# Patient Record
Sex: Male | Born: 1955 | Race: White | Hispanic: No | Marital: Married | Smoking: Former smoker
Health system: Southern US, Community
[De-identification: ages and names within clinical notes are randomized; demographics above are authoritative.]

## PROBLEM LIST (undated history)

## (undated) DIAGNOSIS — G459 Transient cerebral ischemic attack, unspecified: Secondary | ICD-10-CM

## (undated) DIAGNOSIS — G43909 Migraine, unspecified, not intractable, without status migrainosus: Secondary | ICD-10-CM

## (undated) HISTORY — PX: APPENDECTOMY: SHX54

## (undated) HISTORY — DX: Migraine, unspecified, not intractable, without status migrainosus: G43.909

## (undated) HISTORY — PX: TONSILLECTOMY: SUR1361

---

## 1999-07-11 ENCOUNTER — Ambulatory Visit (HOSPITAL_COMMUNITY): Admission: RE | Admit: 1999-07-11 | Discharge: 1999-07-11 | Payer: Self-pay | Admitting: Family Medicine

## 1999-07-11 ENCOUNTER — Encounter: Payer: Self-pay | Admitting: Family Medicine

## 2002-12-19 ENCOUNTER — Ambulatory Visit (HOSPITAL_COMMUNITY): Admission: RE | Admit: 2002-12-19 | Discharge: 2002-12-19 | Payer: Self-pay | Admitting: Surgery

## 2011-11-09 ENCOUNTER — Other Ambulatory Visit: Payer: Self-pay | Admitting: Family Medicine

## 2011-11-09 DIAGNOSIS — R109 Unspecified abdominal pain: Secondary | ICD-10-CM

## 2011-11-10 ENCOUNTER — Ambulatory Visit
Admission: RE | Admit: 2011-11-10 | Discharge: 2011-11-10 | Disposition: A | Discharge status: Home / Self Care | Payer: BC Managed Care – PPO | Source: Ambulatory Visit | Attending: Family Medicine | Admitting: Family Medicine

## 2011-11-10 ENCOUNTER — Other Ambulatory Visit: Payer: Self-pay | Admitting: Family Medicine

## 2011-11-10 DIAGNOSIS — R109 Unspecified abdominal pain: Secondary | ICD-10-CM

## 2011-11-10 DIAGNOSIS — R509 Fever, unspecified: Secondary | ICD-10-CM

## 2011-11-17 ENCOUNTER — Ambulatory Visit: Payer: BC Managed Care – PPO | Admitting: Infectious Diseases

## 2011-11-17 ENCOUNTER — Ambulatory Visit (INDEPENDENT_AMBULATORY_CARE_PROVIDER_SITE_OTHER): Payer: BC Managed Care – PPO | Admitting: Infectious Diseases

## 2011-11-17 ENCOUNTER — Encounter: Payer: Self-pay | Admitting: Infectious Diseases

## 2011-11-17 VITALS — BP 142/83 | HR 94 | Temp 99.0°F | Ht 72.0 in | Wt 196.0 lb

## 2011-11-17 DIAGNOSIS — R509 Fever, unspecified: Secondary | ICD-10-CM

## 2011-11-17 NOTE — Assessment & Plan Note (Signed)
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 He is married. He is fairly old to have gotten a primary CMV or EBV/ mono like syndrome. Will screen him broadly for possible infectious causes of fever as well as non-infectious. He does not have sx that would point towards classical RA or temporal arteritis. He does have a hx of insect bites but these do not carry vectors (in the [place]) that would cause prolonged fevers. If his serologies are (-) and he continues to have fever, will send him for CT scan chest/abd/pelvis to r/o occult abscess or lymphoma.

## 2011-11-17 NOTE — Addendum Note (Signed)
Addended by: Mariea Clonts D on: 11/17/2011 03:39 PM   Modules accepted: Orders

## 2011-11-17 NOTE — Progress Notes (Signed)
  Subjective:    Patient ID: Harold Sherman, male    DOB: February 10, 1955, 56 y.o.   MRN: [id number]  HPI: 56 yo M with hx of Migraines, who beginning around October 29, has had daily fevers to the 101-102 range. They resolve with tylenol and motrin.  11-6 WBC 5.7, (occas atypical Lymphs), u/s R renal cyst, CXR (-). UA 2+ bacteria, 0-1 WBC, 1-3 RBC. AST 76, ALT 106. Hep A Ab (-). Hep B SAg/Core (-), Hep C (-).  Has also had some nausea, gi distress. Appetite has been normal. Usually walks 2-3 miles a day but has had decreased exercise tolerance. Has also noted pain in his R hip. Has been having headaches as well, mostly when he has fever. Rare dry cough.  No new soaps, shampoos, pets, new medicines.  Travel- [place] 2010.  On October 26, had family reunion. Had pork shoulder there and his sister also got fever after this. Her fever resolved after 3 days.   Had an uncle who died in early 21s with TB.   Review of Systems  Constitutional: Positive for fever. Negative for appetite change and unexpected weight change.  Respiratory: Negative for cough.   Gastrointestinal: Negative for diarrhea and constipation.  Genitourinary: Negative for dysuria and difficulty urinating.  Neurological: Positive for headaches.  Hematological: Negative for adenopathy.       Objective:   Physical Exam  Constitutional: He appears well-developed and well-nourished.  HENT:  Mouth/Throat: No oropharyngeal exudate.    Eyes: EOM are normal. Pupils are equal, round, and reactive to light. Right eye exhibits no discharge. Left eye exhibits no discharge. No scleral icterus.  Neck: Neck supple.  Cardiovascular: Normal rate, regular rhythm and normal heart sounds.   Pulmonary/Chest: Effort normal and breath sounds normal.  Abdominal: Soft. Bowel sounds are normal. He exhibits no mass. There is no tenderness. There is no rebound.  Lymphadenopathy:    He has no cervical adenopathy.  Skin: No rash noted.            Assessment & Plan:

## 2011-11-18 ENCOUNTER — Other Ambulatory Visit: Payer: Self-pay | Admitting: Infectious Diseases

## 2011-11-18 ENCOUNTER — Other Ambulatory Visit (INDEPENDENT_AMBULATORY_CARE_PROVIDER_SITE_OTHER): Payer: BC Managed Care – PPO

## 2011-11-18 DIAGNOSIS — R509 Fever, unspecified: Secondary | ICD-10-CM

## 2011-11-18 LAB — COMPREHENSIVE METABOLIC PANEL
AST: 153 U/L — ABNORMAL HIGH (ref 0–37)
Albumin: 3.5 g/dL (ref 3.5–5.2)
Alkaline Phosphatase: 175 U/L — ABNORMAL HIGH (ref 39–117)
BUN: 13 mg/dL (ref 6–23)
Glucose, Bld: 98 mg/dL (ref 70–99)
Potassium: 4 mEq/L (ref 3.5–5.3)
Sodium: 135 mEq/L (ref 135–145)
Total Bilirubin: 0.8 mg/dL (ref 0.3–1.2)
Total Protein: 6.8 g/dL (ref 6.0–8.3)

## 2011-11-19 ENCOUNTER — Telehealth: Payer: Self-pay | Admitting: *Deleted

## 2011-11-19 LAB — CBC WITH DIFFERENTIAL/PLATELET
Basophils Absolute: 0.2 10*3/uL — ABNORMAL HIGH (ref 0.0–0.1)
Basophils Relative: 3 % — ABNORMAL HIGH (ref 0–1)
Eosinophils Absolute: 0.1 10*3/uL (ref 0.0–0.7)
Eosinophils Relative: 2 % (ref 0–5)
HCT: 40.3 % (ref 39.0–52.0)
Hemoglobin: 14 g/dL (ref 13.0–17.0)
Lymphocytes Relative: 64 % — ABNORMAL HIGH (ref 12–46)
Lymphs Abs: 4.5 10*3/uL — ABNORMAL HIGH (ref 0.7–4.0)
MCH: 30 pg (ref 26.0–34.0)
MCHC: 34.7 g/dL (ref 30.0–36.0)
MCV: 86.5 fL (ref 78.0–100.0)
Monocytes Absolute: 0.4 10*3/uL (ref 0.1–1.0)
Monocytes Relative: 5 % (ref 3–12)
Neutro Abs: 1.8 10*3/uL (ref 1.7–7.7)
Neutrophils Relative %: 26 % — ABNORMAL LOW (ref 43–77)
Platelets: 173 10*3/uL (ref 150–400)
RBC: 4.66 MIL/uL (ref 4.22–5.81)
RDW: 12.8 % (ref 11.5–15.5)
WBC: 7 10*3/uL (ref 4.0–10.5)

## 2011-11-19 LAB — RPR

## 2011-11-19 NOTE — Telephone Encounter (Signed)
Patient notified to stop Tylenol due to elevated liver tests per Dr. Ninetta Lights. Wendall Mola CMA

## 2011-11-19 NOTE — Progress Notes (Signed)
Patient notified Harold Sherman  

## 2011-11-19 NOTE — Telephone Encounter (Signed)
Message copied by Macy Mis on Fri Nov 19, 2011  1:30 PM ------      Message from: HATCHER, JEFFREY C      Created: Fri Nov 19, 2011 10:40 AM       Pt needs to stop tylenol. When is his f/u appt?

## 2011-11-22 LAB — HIV-1 RNA ULTRAQUANT REFLEX TO GENTYP+
HIV 1 RNA Quant: <20 copies/mL (ref <20)
HIV-1 RNA Quant, Log: <1.3 {Log} (ref <1.30)

## 2011-11-22 LAB — TOXOPLASMA GONDII ANTIBODY, IGG: Toxoplasma IgG Ratio: 44.4 IU/mL — ABNORMAL HIGH (ref <6.0)

## 2011-11-22 LAB — CMV IGM: CMV IgM: >3.5 — ABNORMAL HIGH (ref <0.90)

## 2011-11-22 LAB — QUANTIFERON TB GOLD ASSAY (BLOOD): Interferon Gamma Release Assay: NEGATIVE

## 2011-11-24 LAB — CULTURE, BLOOD (SINGLE): Organism ID, Bacteria: NO GROWTH

## 2011-11-30 ENCOUNTER — Ambulatory Visit (INDEPENDENT_AMBULATORY_CARE_PROVIDER_SITE_OTHER): Payer: BC Managed Care – PPO | Admitting: Infectious Diseases

## 2011-11-30 ENCOUNTER — Encounter: Payer: Self-pay | Admitting: Infectious Diseases

## 2011-11-30 VITALS — BP 152/88 | HR 73 | Temp 98.1°F | Wt 187.0 lb

## 2011-11-30 DIAGNOSIS — R509 Fever, unspecified: Secondary | ICD-10-CM

## 2011-11-30 NOTE — Progress Notes (Signed)
  Subjective:    Patient ID: Harold Sherman, male    DOB: [birth year], 56 y.o.   MRN: [id number]  HPI 56 yo M with hx of Migraines, who beginning around October 29, has had daily fevers to the 101-102 range. They resolve with tylenol and motrin.  (11-10-11) WBC 5.7, (occas atypical Lymphs), u/s R renal cyst, CXR (-). UA 2+ bacteria, 0-1 WBC, 1-3 RBC. AST 76, ALT 106. Hep A Ab (-). Hep B SAg/Core (-), Hep C (-).   Travel- [place] 2010. On October 26, had family reunion. Had pork shoulder there and his sister also got fever after this. Her fever resolved after 3 days.  Had an uncle who died in early 40s with TB.  AT his previous visit he was eval for FUO- his HIV RNA/Quantiferon gold/EBV were all (-). He had CMV IgM+. He had a CXR (-) and abd u/s (-) as well except for renal cyst.  Still having some occas feeling of fever. After initial visit, had temps for 3-4 hours/night. This resolved after 1 week, has gone back to work. Still having some feeling of dampness of his night clothes. Temps at night have been in 98-99 range. Still taking motrin at night. Still feeling fatigued after working.    Review of Systems  Constitutional: Positive for fatigue. Negative for fever, chills and appetite change.  Respiratory: Negative for cough and shortness of breath.   Gastrointestinal: Negative for diarrhea and constipation.  Genitourinary: Negative for difficulty urinating.       Objective:   Physical Exam  Constitutional: He appears well-developed and well-nourished.  HENT:  Mouth/Throat: No oropharyngeal exudate.  Eyes: EOM are normal. Pupils are equal, round, and reactive to light. No scleral icterus.  Neck: Neck supple.  Cardiovascular: Normal rate, regular rhythm and normal heart sounds.   Pulmonary/Chest: Effort normal and breath sounds normal.  Abdominal: Soft. Bowel sounds are normal. He exhibits no distension and no mass. There is no tenderness.  Lymphadenopathy:    He has no cervical  adenopathy.          Assessment & Plan:

## 2011-11-30 NOTE — Assessment & Plan Note (Signed)
So far all we have is possible evidence of CMV and abn LFTs. Will repeat his LFTs. Ask him to stop NSAID to see if his fever is resolved. If it is not resolved, will send him for CT abd/pelvis.

## 2011-12-01 LAB — COMPREHENSIVE METABOLIC PANEL
ALT: 107 U/L — ABNORMAL HIGH (ref 0–53)
AST: 60 U/L — ABNORMAL HIGH (ref 0–37)
Albumin: 3.7 g/dL (ref 3.5–5.2)
CO2: 31 mEq/L (ref 19–32)
Calcium: 9.1 mg/dL (ref 8.4–10.5)
Chloride: 101 mEq/L (ref 96–112)
Potassium: 4.6 mEq/L (ref 3.5–5.3)
Sodium: 136 mEq/L (ref 135–145)
Total Protein: 7.2 g/dL (ref 6.0–8.3)

## 2011-12-01 LAB — HEPATITIS B CORE ANTIBODY, TOTAL: Hep B Core Total Ab: NEGATIVE

## 2011-12-01 LAB — HEPATITIS B SURFACE ANTIGEN: Hepatitis B Surface Ag: NEGATIVE

## 2011-12-14 ENCOUNTER — Ambulatory Visit (INDEPENDENT_AMBULATORY_CARE_PROVIDER_SITE_OTHER): Payer: BC Managed Care – PPO | Admitting: Infectious Diseases

## 2011-12-14 ENCOUNTER — Encounter: Payer: Self-pay | Admitting: Infectious Diseases

## 2011-12-14 VITALS — BP 131/73 | HR 73 | Temp 98.3°F | Ht 72.0 in | Wt 198.2 lb

## 2011-12-14 DIAGNOSIS — K469 Unspecified abdominal hernia without obstruction or gangrene: Secondary | ICD-10-CM | POA: Insufficient documentation

## 2011-12-14 DIAGNOSIS — R509 Fever, unspecified: Secondary | ICD-10-CM

## 2011-12-14 NOTE — Progress Notes (Signed)
  Subjective:    Patient ID: Harold Sherman, male    DOB: Sep 27, 1955, 56 y.o.   MRN: [id number]  HPI 56 yo M with hx of Migraines, who beginning around October 29, has had daily fevers to the 101-102 range. They resolve with tylenol and motrin.  (11-10-11) WBC 5.7, (occas atypical Lymphs), u/s R renal cyst, CXR (-). UA 2+ bacteria, 0-1 WBC, 1-3 RBC. AST 76, ALT 106. Hep A Ab (-). Hep B SAg/Core (-), Hep C (-).  Travel- [place] 2010. On October 26, had family reunion. Had pork shoulder there and his sister also got fever after this. Her fever resolved after 3 days.  Had an uncle who died in early 55s with TB.  AT his previous visit (11-10-11) he was eval for FUO- his HIV RNA/Quantiferon gold/EBV were all (-). He had CMV IgM+. He had a CXR (-) and abd u/s (-) as well except for renal cyst. He was asked to stop his anti-pyretics.  Has been feeling better since last visit. No further fevers, no night sweats (for last 5 days). His fatigue has improved. Has been eating well. Has been holding his weight well (up 11# since 11-30-11). Over last week has had some mild discomfort of his L flank. This has improved over last few days as well. No dysuria, no cloudiness. Has a SQ knot on his L flank (previous told he had a hernia there).  Feels like a mosquito bite. Totally off ibuprofen, tylenol.     Review of Systems  Constitutional: Negative for fever, chills, appetite change and unexpected weight change.  Gastrointestinal: Positive for abdominal pain.  Genitourinary: Negative for dysuria and difficulty urinating.       Objective:   Physical Exam  Constitutional: He appears well-developed and well-nourished.  HENT:  Mouth/Throat: No oropharyngeal exudate.  Eyes: EOM are normal. Pupils are equal, round, and reactive to light.  Neck: Neck supple.  Cardiovascular: Normal rate, regular rhythm and normal heart sounds.   Pulmonary/Chest: Effort normal and breath sounds normal.  Abdominal: Soft. Bowel  sounds are normal. He exhibits no distension.    Lymphadenopathy:    He has no cervical adenopathy.          Assessment & Plan:

## 2011-12-14 NOTE — Assessment & Plan Note (Signed)
This is very small and currently asx. i have asked him to call his MD if he has problems (can't reduce, persistent pain).

## 2011-12-14 NOTE — Assessment & Plan Note (Signed)
His syndrome appears to have resolved. 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 This would be consistent with his increased LFTs). Will see him back prn.

## 2012-06-22 ENCOUNTER — Other Ambulatory Visit: Payer: Self-pay | Admitting: Family Medicine

## 2012-06-22 DIAGNOSIS — R1012 Left upper quadrant pain: Secondary | ICD-10-CM

## 2012-06-22 DIAGNOSIS — R6881 Early satiety: Secondary | ICD-10-CM

## 2012-06-23 ENCOUNTER — Ambulatory Visit
Admission: RE | Admit: 2012-06-23 | Discharge: 2012-06-23 | Disposition: A | Discharge status: Home / Self Care | Payer: BC Managed Care – PPO | Source: Ambulatory Visit | Attending: Family Medicine | Admitting: Family Medicine

## 2012-06-23 DIAGNOSIS — R1012 Left upper quadrant pain: Secondary | ICD-10-CM

## 2012-06-23 DIAGNOSIS — R6881 Early satiety: Secondary | ICD-10-CM

## 2012-06-23 MED ORDER — IOHEXOL 300 MG/ML  SOLN
125.0000 mL | Freq: Once | INTRAMUSCULAR | Status: AC | PRN
  Administered 2012-06-23: 125 mL via INTRAVENOUS

## 2012-11-09 ENCOUNTER — Other Ambulatory Visit: Payer: Self-pay

## 2016-03-12 ENCOUNTER — Emergency Department (HOSPITAL_COMMUNITY): Payer: BLUE CROSS/BLUE SHIELD

## 2016-03-12 ENCOUNTER — Emergency Department (HOSPITAL_COMMUNITY)
Admission: EM | Admit: 2016-03-12 | Discharge: 2016-03-12 | Disposition: A | Discharge status: Home / Self Care | Payer: BLUE CROSS/BLUE SHIELD | Attending: Emergency Medicine | Admitting: Emergency Medicine

## 2016-03-12 ENCOUNTER — Encounter (HOSPITAL_COMMUNITY): Payer: Self-pay | Admitting: Emergency Medicine

## 2016-03-12 DIAGNOSIS — Z79899 Other long term (current) drug therapy: Secondary | ICD-10-CM | POA: Diagnosis not present

## 2016-03-12 DIAGNOSIS — Z7982 Long term (current) use of aspirin: Secondary | ICD-10-CM | POA: Diagnosis not present

## 2016-03-12 DIAGNOSIS — R410 Disorientation, unspecified: Secondary | ICD-10-CM | POA: Insufficient documentation

## 2016-03-12 DIAGNOSIS — Z8673 Personal history of transient ischemic attack (TIA), and cerebral infarction without residual deficits: Secondary | ICD-10-CM | POA: Diagnosis not present

## 2016-03-12 DIAGNOSIS — Z87891 Personal history of nicotine dependence: Secondary | ICD-10-CM | POA: Insufficient documentation

## 2016-03-12 DIAGNOSIS — R4182 Altered mental status, unspecified: Secondary | ICD-10-CM | POA: Diagnosis present

## 2016-03-12 HISTORY — DX: Transient cerebral ischemic attack, unspecified: G45.9

## 2016-03-12 LAB — COMPREHENSIVE METABOLIC PANEL
ALBUMIN: 4.4 g/dL (ref 3.5–5.0)
ALT: 36 U/L (ref 17–63)
ANION GAP: 8 (ref 5–15)
AST: 28 U/L (ref 15–41)
Alkaline Phosphatase: 70 U/L (ref 38–126)
BUN: 16 mg/dL (ref 6–20)
CHLORIDE: 106 mmol/L (ref 101–111)
CO2: 27 mmol/L (ref 22–32)
Calcium: 9.7 mg/dL (ref 8.9–10.3)
Creatinine, Ser: 0.99 mg/dL (ref 0.61–1.24)
GFR calc non Af Amer: >60 mL/min (ref >60)
GLUCOSE: 87 mg/dL (ref 65–99)
Potassium: 4.2 mmol/L (ref 3.5–5.1)
SODIUM: 141 mmol/L (ref 135–145)
Total Bilirubin: 1.3 mg/dL — ABNORMAL HIGH (ref 0.3–1.2)
Total Protein: 7.4 g/dL (ref 6.5–8.1)

## 2016-03-12 LAB — URINALYSIS, ROUTINE W REFLEX MICROSCOPIC
Bilirubin Urine: NEGATIVE
GLUCOSE, UA: NEGATIVE mg/dL
HGB URINE DIPSTICK: NEGATIVE
Ketones, ur: 5 mg/dL — AB
Leukocytes, UA: NEGATIVE
Nitrite: NEGATIVE
Protein, ur: NEGATIVE mg/dL
SPECIFIC GRAVITY, URINE: 1.005 (ref 1.005–1.030)
pH: 6 (ref 5.0–8.0)

## 2016-03-12 LAB — CBC
HCT: 45.2 % (ref 39.0–52.0)
Hemoglobin: 16.6 g/dL (ref 13.0–17.0)
MCH: 31.3 pg (ref 26.0–34.0)
MCHC: 36.7 g/dL — AB (ref 30.0–36.0)
MCV: 85.1 fL (ref 78.0–100.0)
Platelets: 186 10*3/uL (ref 150–400)
RBC: 5.31 MIL/uL (ref 4.22–5.81)
RDW: 12.2 % (ref 11.5–15.5)
WBC: 6.5 10*3/uL (ref 4.0–10.5)

## 2016-03-12 LAB — RAPID URINE DRUG SCREEN, HOSP PERFORMED
AMPHETAMINES: NOT DETECTED
BARBITURATES: NOT DETECTED
BENZODIAZEPINES: NOT DETECTED
COCAINE: NOT DETECTED
Opiates: NOT DETECTED
TETRAHYDROCANNABINOL: NOT DETECTED

## 2016-03-12 LAB — I-STAT TROPONIN, ED: Troponin i, poc: 0 ng/mL (ref 0.00–0.08)

## 2016-03-12 LAB — AMMONIA: Ammonia: 20 umol/L (ref 9–35)

## 2016-03-12 LAB — ETHANOL: Alcohol, Ethyl (B): <5 mg/dL (ref <5)

## 2016-03-12 LAB — SALICYLATE LEVEL: Salicylate Lvl: <7 mg/dL (ref 2.8–30.0)

## 2016-03-12 LAB — CBG MONITORING, ED: Glucose-Capillary: 78 mg/dL (ref 65–99)

## 2016-03-12 LAB — ACETAMINOPHEN LEVEL: Acetaminophen (Tylenol), Serum: <10 ug/mL — ABNORMAL LOW (ref 10–30)

## 2016-03-12 NOTE — ED Notes (Signed)
[facility] FRANK Provider at bedside.

## 2016-03-12 NOTE — ED Triage Notes (Signed)
Patient states that about 1 hour ago, he had sudden onset of confusion. For example, he was unable to log in on his computer and felt "weird." States he has a hx of TIA. Patient is alert, oriented to person and place. Patient disoriented to time. States it is February. Patient is laughing during triage. Patient is ambulatory. Denies pain.

## 2016-03-12 NOTE — ED Provider Notes (Signed)
[facility]-EMERGENCY DEPT Provider Note   CSN: [id number] Arrival date & time: 03/12/16  1210     History   Chief Complaint Chief Complaint  Patient presents with  . Altered Mental Status    HPI Dawsyn Ramsaran Israelson is a 61 y.o. male with PMHx of TIA over 10 years ago presents today with sudden onset of confusion about 2 hours ago. He states he was unable to log in on his computer and felt weird. He denies any pain. He denies injury. He denies LOC. He states he thinks it is similar to his previous TIA. He denies drinking today but does admit to socially drinking. He denies drug use. He denies chest pain, shortness of breath, sweating, urinary symptoms, changes in bowel movements, nausea, vomiting, diarrhea. Pt does take a baby aspirin everyday and trazadone 5 nights a week and benadryl weekends for 3 months. He admits to high stress at work. He states he feels "high" and probable hallucinations. No previous hx of hallucinations. He feels like he is having "other experiences" and in a whole "different dimention" right now. However he denies visual and audio hallucinations. He denies suicidal or homicidal ideations. He denies hx of psychiatric disability but does admit to family history of psychiatric problems. His wife states that he is not normally like this.   The history is provided by the patient and the spouse. No language interpreter was used.  Altered Mental Status   Associated symptoms include confusion. Pertinent negatives include no weakness.    Past Medical History:  Diagnosis Date  . Migraine   . TIA (transient ischemic attack)     Patient Active Problem List   Diagnosis Date Noted  . Hernia of abdominal cavity 12/14/2011  . FUO (fever of unknown origin) 11/17/2011    Past Surgical History:  Procedure Laterality Date  . APPENDECTOMY    . TONSILLECTOMY         Home Medications    Prior to Admission medications   Medication Sig Start Date End Date Taking? Authorizing  Provider  aspirin 81 MG tablet Take 81 mg by mouth daily.   Yes Historical Provider, MD  b complex vitamins tablet Take 1 tablet by mouth daily.   Yes Historical Provider, MD  ibuprofen (ADVIL,MOTRIN) 200 MG tablet Take 400 mg by mouth every 6 (six) hours as needed (back pain).   Yes Historical Provider, MD  Multiple Vitamins-Minerals (MULTIVITAMIN WITH MINERALS) tablet Take 1 tablet by mouth daily.   Yes Historical Provider, MD  traZODone (DESYREL) 50 MG tablet Take 50 mg by mouth at bedtime.   Yes Historical Provider, MD    Family History Family History  Problem Relation Age of Onset  . Heart disease Mother   . Alzheimer's disease Mother   . Diabetes Father 81  . Cancer Father     prostate Ca    Social History Social History  Substance Use Topics  . Smoking status: Former Games developer  . Smokeless tobacco: Never Used  . Alcohol use 1.0 oz/week    2 Standard drinks or equivalent per week     Comment: occasional     Allergies   Patient has no known allergies.   Review of Systems Review of Systems  Constitutional: Negative for chills and fever.  Neurological: Negative for weakness and numbness.  Psychiatric/Behavioral: Positive for confusion.  All other systems reviewed and are negative.    Physical Exam Updated Vital Signs BP (!) 145/103 (BP Location: Left Arm)   Pulse (!) 58  Temp 97.4 F (36.3 C) (Oral)   Resp 18   Ht 6' (1.829 m)   Wt 86.2 kg   SpO2 100%   BMI 25.77 kg/m   Physical Exam  Constitutional: He appears well-developed and well-nourished.  Well appearing.   HENT:  Head: Normocephalic and atraumatic.  Nose: Nose normal.  Mouth/Throat: Oropharynx is clear and moist.  Head without evidence of wound, redness, swelling, deformity.  Eyes: Conjunctivae and EOM are normal. Pupils are equal, round, and reactive to light.  Neck: Normal range of motion.  Cardiovascular: Normal rate, normal heart sounds and intact distal pulses.   Pulmonary/Chest: Effort  normal and breath sounds normal. No respiratory distress. He has no wheezes. He has no rales.  Normal work of breathing. No respiratory distress noted.   Abdominal: Soft. There is no tenderness. There is no rebound and no guarding.  Soft and nontender.  Musculoskeletal: Normal range of motion. He exhibits no tenderness.  Neurological: He is alert. He has normal strength. He displays a negative Romberg sign. GCS eye subscore is 4. GCS verbal subscore is 5. GCS motor subscore is 6.  Patient was alert and oriented to person and place. With the nurse she thought it was a month of February. On my assessment he thought it was 2017 and later corrected himself stating it was 2018 and it was March.  Cranial Nerves:  III,IV, VI: ptosis not present, extra-ocular movements intact bilaterally, direct and consensual pupillary light reflexes intact bilaterally V: facial sensation, jaw opening, and bite strength equal bilaterally VII: eyebrow raise, eyelid close, smile, frown, pucker equal bilaterally VIII: hearing grossly normal bilaterally  IX,X: palate elevation and swallowing intact XI: bilateral shoulder shrug and lateral head rotation equal and strong XII: midline tongue extension  Negative pronator drift, negative Romberg, negative RAM's, negative heel-to-shin, negative finger to nose.    Sensory intact.  Muscle strength 5/5 Patient able to ambulate without difficulty.   Skin: Skin is warm.  Psychiatric: He has a normal mood and affect. His behavior is normal.  Nursing note and vitals reviewed.    ED Treatments / Results  Labs (all labs ordered are listed, but only abnormal results are displayed) Labs Reviewed  COMPREHENSIVE METABOLIC PANEL - Abnormal; Notable for the following:       Result Value   Total Bilirubin 1.3 (*)    All other components within normal limits  CBC - Abnormal; Notable for the following:    MCHC 36.7 (*)    All other components within normal limits  URINALYSIS,  ROUTINE W REFLEX MICROSCOPIC - Abnormal; Notable for the following:    Ketones, ur 5 (*)    All other components within normal limits  ACETAMINOPHEN LEVEL - Abnormal; Notable for the following:    Acetaminophen (Tylenol), Serum <10 (*)    All other components within normal limits  SALICYLATE LEVEL  RAPID URINE DRUG SCREEN, HOSP PERFORMED  ETHANOL  AMMONIA  CBG MONITORING, ED  I-STAT TROPOININ, ED    EKG  EKG Interpretation None       Radiology Dg Chest 2 View  Result Date: 03/12/2016 CLINICAL DATA:  Confusion. EXAM: CHEST  2 VIEW COMPARISON:  None. FINDINGS: The heart size and mediastinal contours are within normal limits. Both lungs are clear. The visualized skeletal structures are unremarkable. IMPRESSION: Negative two view chest x-ray Electronically Signed   By: Marin Roberts M.D.   On: 03/12/2016 13:25   Ct Head Wo Contrast  Result Date: 03/12/2016 CLINICAL DATA:  Patient states that about 1 hour ago, he had sudden onset of confusion. For example, he was unable to log in on his computer and felt "weird." States he has a hx of TIA. Patient is alert, oriented to person and place. Patient is disoriented to time. Patient states is February. Laughing during triage. Ambulatory. Denies pain. EXAM: CT HEAD WITHOUT CONTRAST TECHNIQUE: Contiguous axial images were obtained from the base of the skull through the vertex without intravenous contrast. COMPARISON:  None. FINDINGS: Brain: No evidence of acute infarction, hemorrhage, hydrocephalus, extra-axial collection or mass lesion/mass effect. Vascular: No hyperdense vessel or unexpected calcification. Skull: Normal. Negative for fracture or focal lesion. Sinuses/Orbits: No acute finding. Other: None IMPRESSION: Negative exam. Electronically Signed   By: Norva PavlovElizabeth  Brown M.D.   On: 03/12/2016 13:57    Procedures Procedures (including critical care time)  Medications Ordered in ED Medications - No data to display   Initial  Impression / Assessment and Plan / ED Course  I have reviewed the triage vital signs and the nursing notes.  Pertinent labs & imaging results that were available during my care of the patient were reviewed by me and considered in my medical decision making (see chart for details).     Patient here within normal complaints of feeling confused, weird and feeling like in "different dimension". He has a history of TIA. Pt also has family hx of psychiatric disability. Patient has not been able to sleep for some time and today's symptoms may be due to lack of sleep. Here patient is in no apparent distress, hemodynamically stable, afebrile. He is neurologically intact. He was standing and walking without difficulty. He has no pain here and no history of injury. He had no history of facial droop, slurring speech, one-sided weakness. It does not appear that he had strokelike symptoms to believe that he may have had a stroke or TIA. All his lab work is reassuring. EKG without acute abnormality. CT negative for any abnormality. A low suspicion for stroke, TIA, mass lesion, or psychiatric cause at this time. He does not appear manic at this time. He appears safe for discharge. Patient agrees with assessment and plan. He verbalizes understanding. He is to follow up with his primary care provider in 3 days ranges visit. Reasons to immediately return to the emergency department discussed.   Patient also seen and evaluated by Dr. Eudelia Bunchardama who agreed with assessment and plan.    Final Clinical Impressions(s) / ED Diagnoses   Final diagnoses:  Confusion    New Prescriptions New Prescriptions   No medications on file     [address]Francisco Manuel ReinbeckEspina, GeorgiaPA 03/12/16 1548    Nira ConnPedro Eduardo Cardama, MD 03/12/16 [id number]

## 2016-03-12 NOTE — ED Notes (Signed)
Patient transported to CT 

## 2016-03-12 NOTE — Discharge Instructions (Signed)
Please schedule appointment with your primary care provider today regarding today's visit.  Get help right away if: You develop severe headaches, repeated vomiting, seizures, blackouts, or slurred speech. There is increasing confusion, weakness, numbness, restlessness, or personality changes. You develop a loss of balance, have marked dizziness, feel uncoordinated, or fall. You have delusions, hallucinations, or develop severe anxiety. Your family members think you need to be rechecked.

## 2016-04-05 ENCOUNTER — Other Ambulatory Visit: Payer: Self-pay | Admitting: Family Medicine

## 2016-04-05 DIAGNOSIS — R404 Transient alteration of awareness: Secondary | ICD-10-CM

## 2016-04-28 ENCOUNTER — Ambulatory Visit
Admission: RE | Admit: 2016-04-28 | Discharge: 2016-04-28 | Disposition: A | Discharge status: Home / Self Care | Payer: BLUE CROSS/BLUE SHIELD | Source: Ambulatory Visit | Attending: Family Medicine | Admitting: Family Medicine

## 2016-04-28 DIAGNOSIS — R404 Transient alteration of awareness: Secondary | ICD-10-CM

## 2016-06-17 ENCOUNTER — Other Ambulatory Visit: Payer: Self-pay | Admitting: Family Medicine

## 2016-06-17 ENCOUNTER — Ambulatory Visit
Admission: RE | Admit: 2016-06-17 | Discharge: 2016-06-17 | Disposition: A | Discharge status: Home / Self Care | Payer: BLUE CROSS/BLUE SHIELD | Source: Ambulatory Visit | Attending: Family Medicine | Admitting: Family Medicine

## 2016-06-17 DIAGNOSIS — M533 Sacrococcygeal disorders, not elsewhere classified: Secondary | ICD-10-CM

## 2017-05-03 ENCOUNTER — Other Ambulatory Visit: Payer: Self-pay | Admitting: Chiropractic Medicine

## 2017-05-03 ENCOUNTER — Ambulatory Visit
Admission: RE | Admit: 2017-05-03 | Discharge: 2017-05-03 | Disposition: A | Discharge status: Home / Self Care | Payer: Managed Care, Other (non HMO) | Source: Ambulatory Visit | Attending: Chiropractic Medicine | Admitting: Chiropractic Medicine

## 2017-05-03 DIAGNOSIS — G8929 Other chronic pain: Secondary | ICD-10-CM

## 2017-05-03 DIAGNOSIS — M5442 Lumbago with sciatica, left side: Principal | ICD-10-CM

## 2018-02-25 IMAGING — CT CT HEAD W/O CM
3 of 4 series · 15 of 47 positions shown, 18 images · non-contrast
Comparison: None.

CLINICAL DATA: Patient states that about 1 hour ago, he had sudden
onset of confusion. For example, he was unable to log in on his
computer and felt "weird." States he has a hx of TIA. Patient is
alert, oriented to person and place. Patient is disoriented to time.
Patient states is [place]. Laughing during triage. Ambulatory.
Denies pain.

EXAM:
CT HEAD WITHOUT CONTRAST
TECHNIQUE: Contiguous axial images were obtained from the base of the skull
through the vertex without intravenous contrast.

[Series 2: head w/o · axial · non-contrast · 0.45mm/px · z∈[-113,+22]mm · 9 of 33 slices shown, 12 images]
[im 3/33  brain]
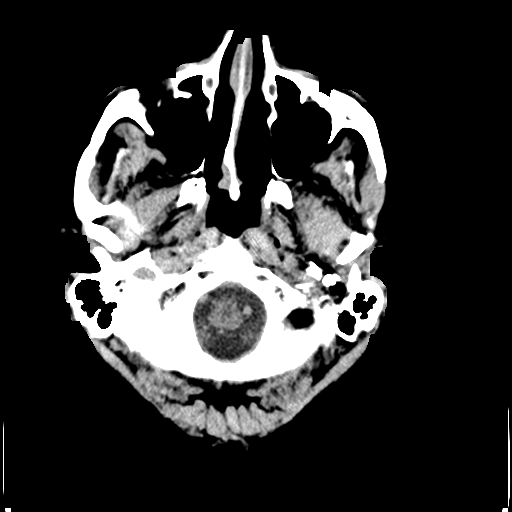
[im 3/33  bone]
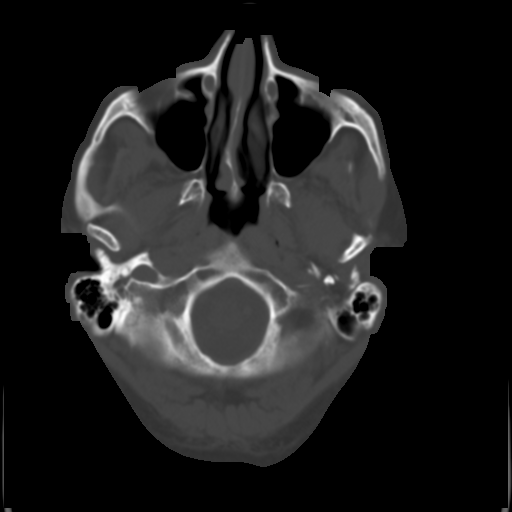
[im 7/33  brain]
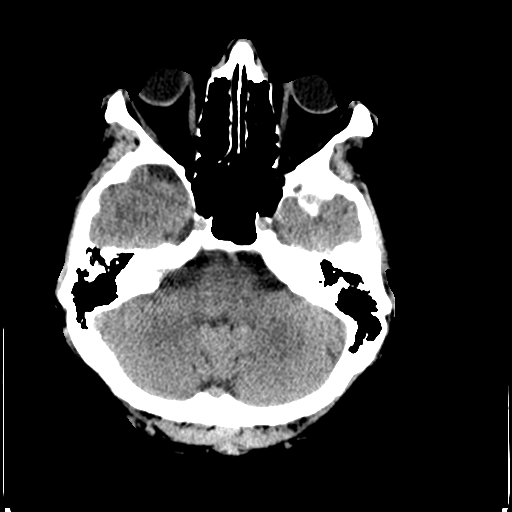
[im 10/33  brain]
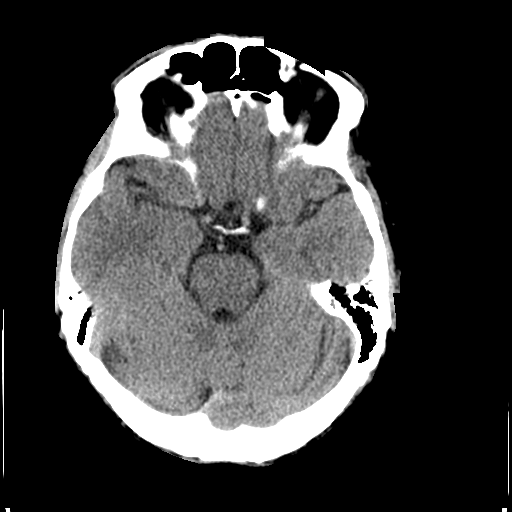
[im 14/33  brain]
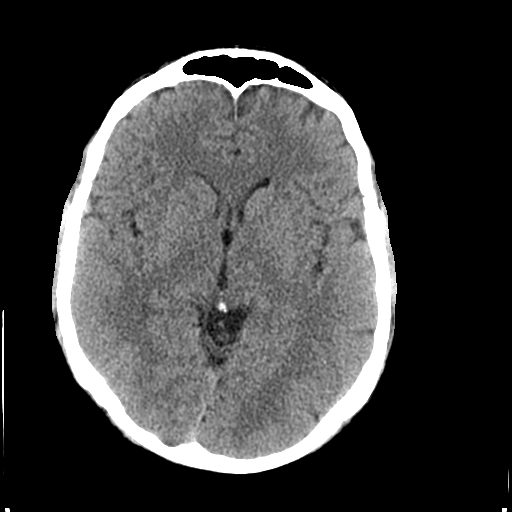
[im 17/33  brain]
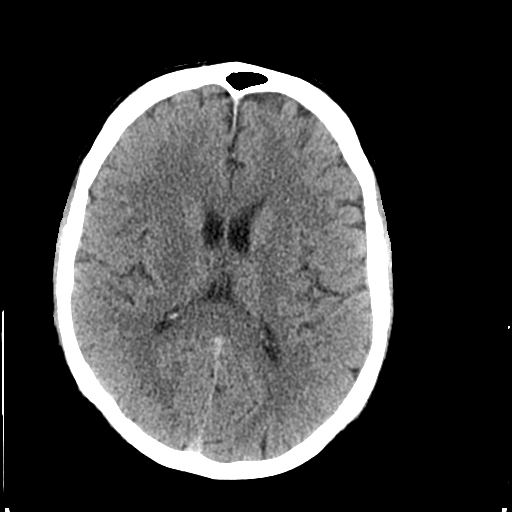
[im 17/33  bone]
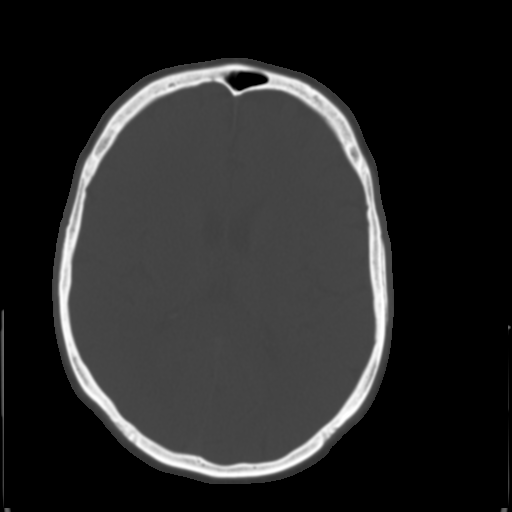
[im 19/33  brain]
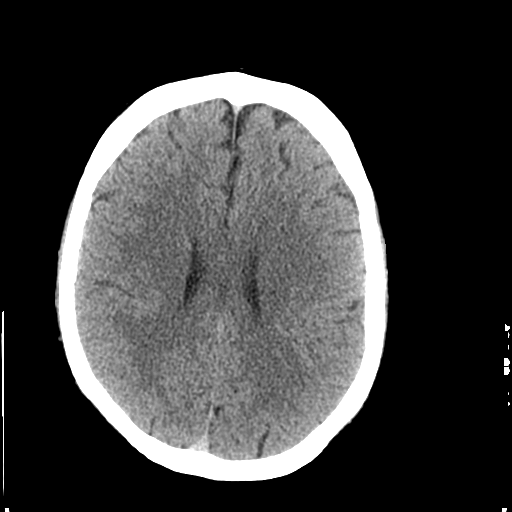
[im 23/33  brain]
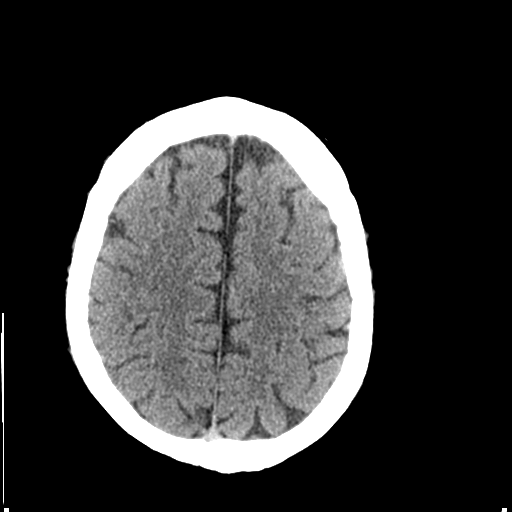
[im 26/33  brain]
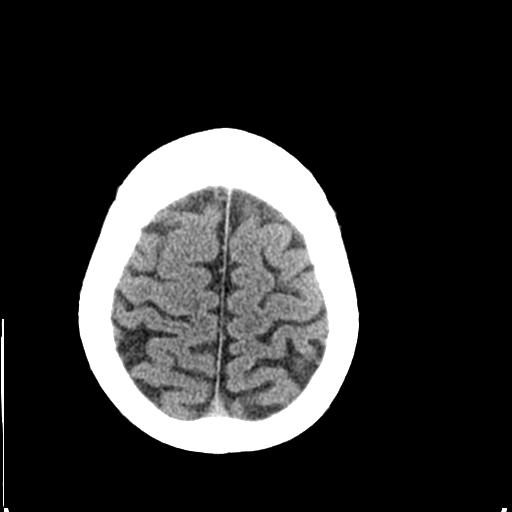
[im 30/33  brain]
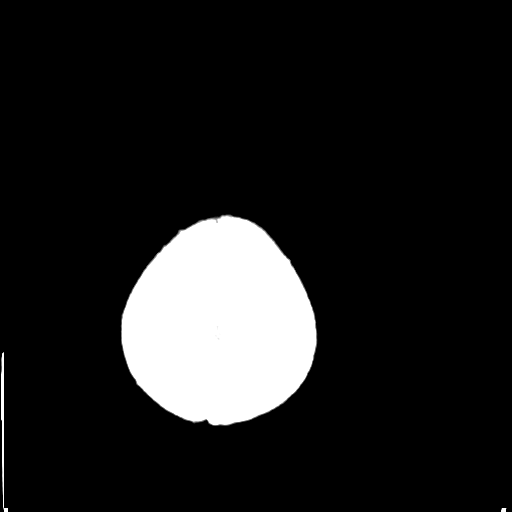
[im 30/33  bone]
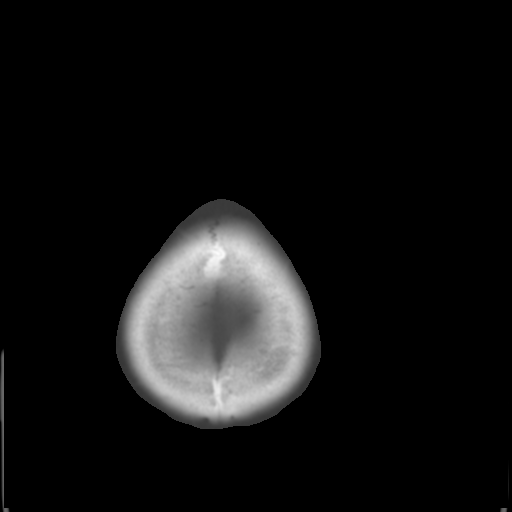

[Series 5: coronal · coronal · 0.31mm/px · 3 of 73 slices shown]
[im 25/73  brain]
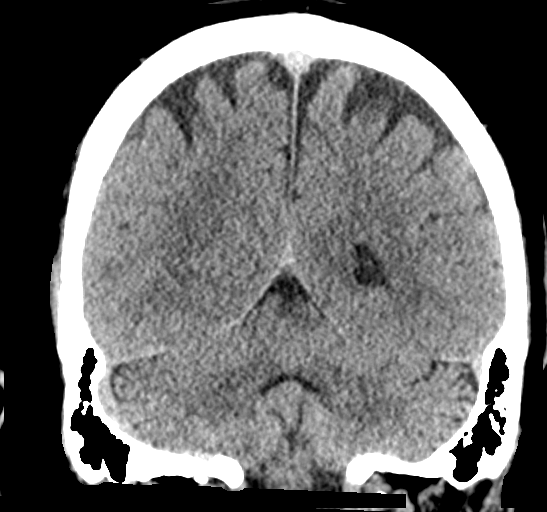
[im 33/73  brain]
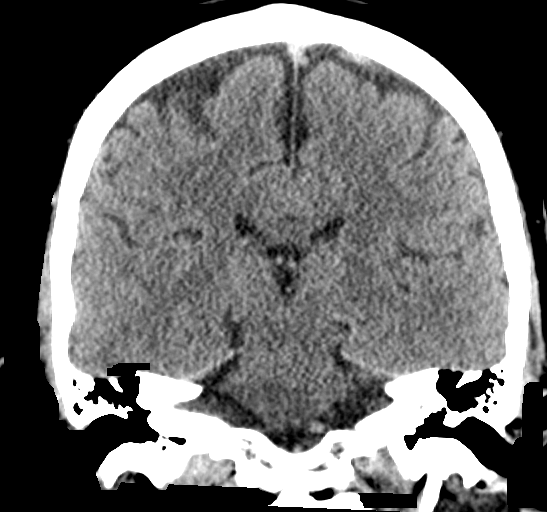
[im 41/73  brain]
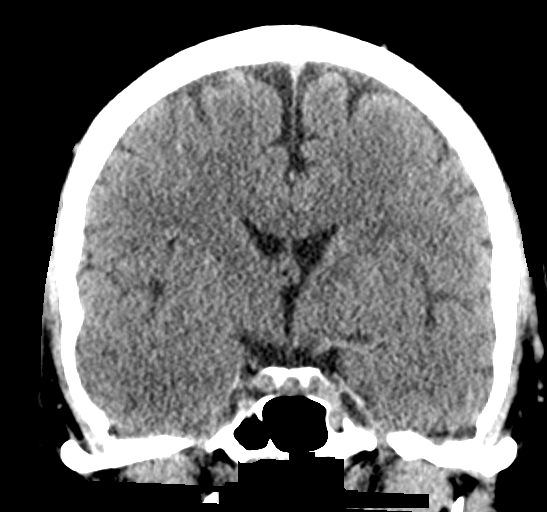

[Series 6: sagittal · sagittal · 0.32mm/px · 3 of 63 slices shown]
[im 21/63  brain]
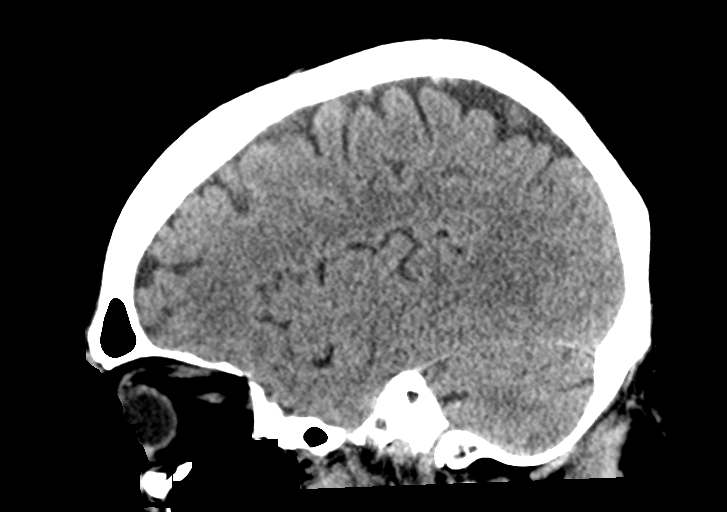
[im 32/63  brain]
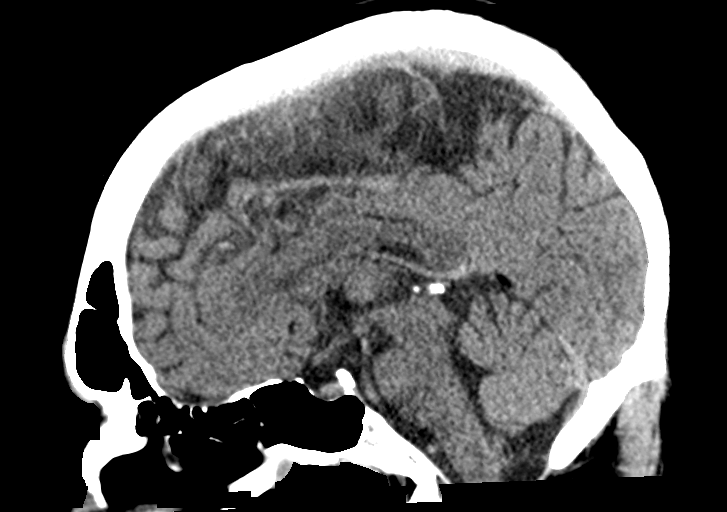
[im 42/63  brain]
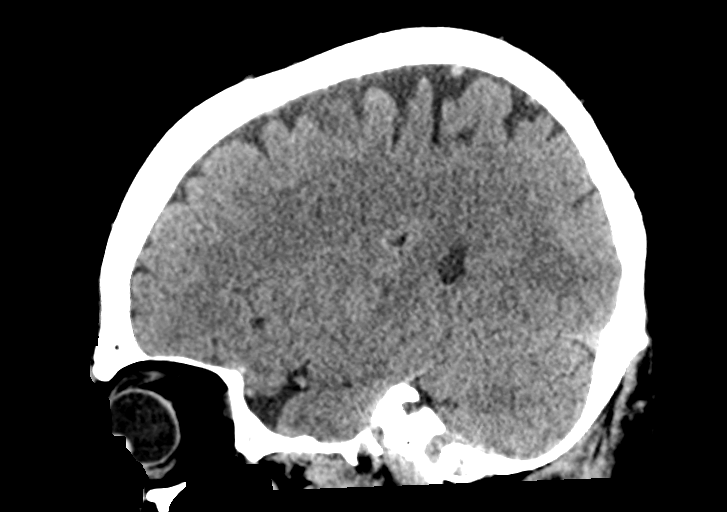

[15 of 47 positions shown; findings below may reference images not displayed]

FINDINGS: Brain: No evidence of acute infarction, hemorrhage, hydrocephalus,
extra-axial collection or mass lesion/mass effect.

Vascular: No hyperdense vessel or unexpected calcification.

Skull: Normal. Negative for fracture or focal lesion.

Sinuses/Orbits: No acute finding.

Other: None
IMPRESSION: Negative exam.

## 2018-02-25 IMAGING — CR DG CHEST 2V
2 series · 2 of 2 positions shown · non-contrast
Comparison: None.

CLINICAL DATA: Confusion.

EXAM:
CHEST  2 VIEW

[w chest pa]
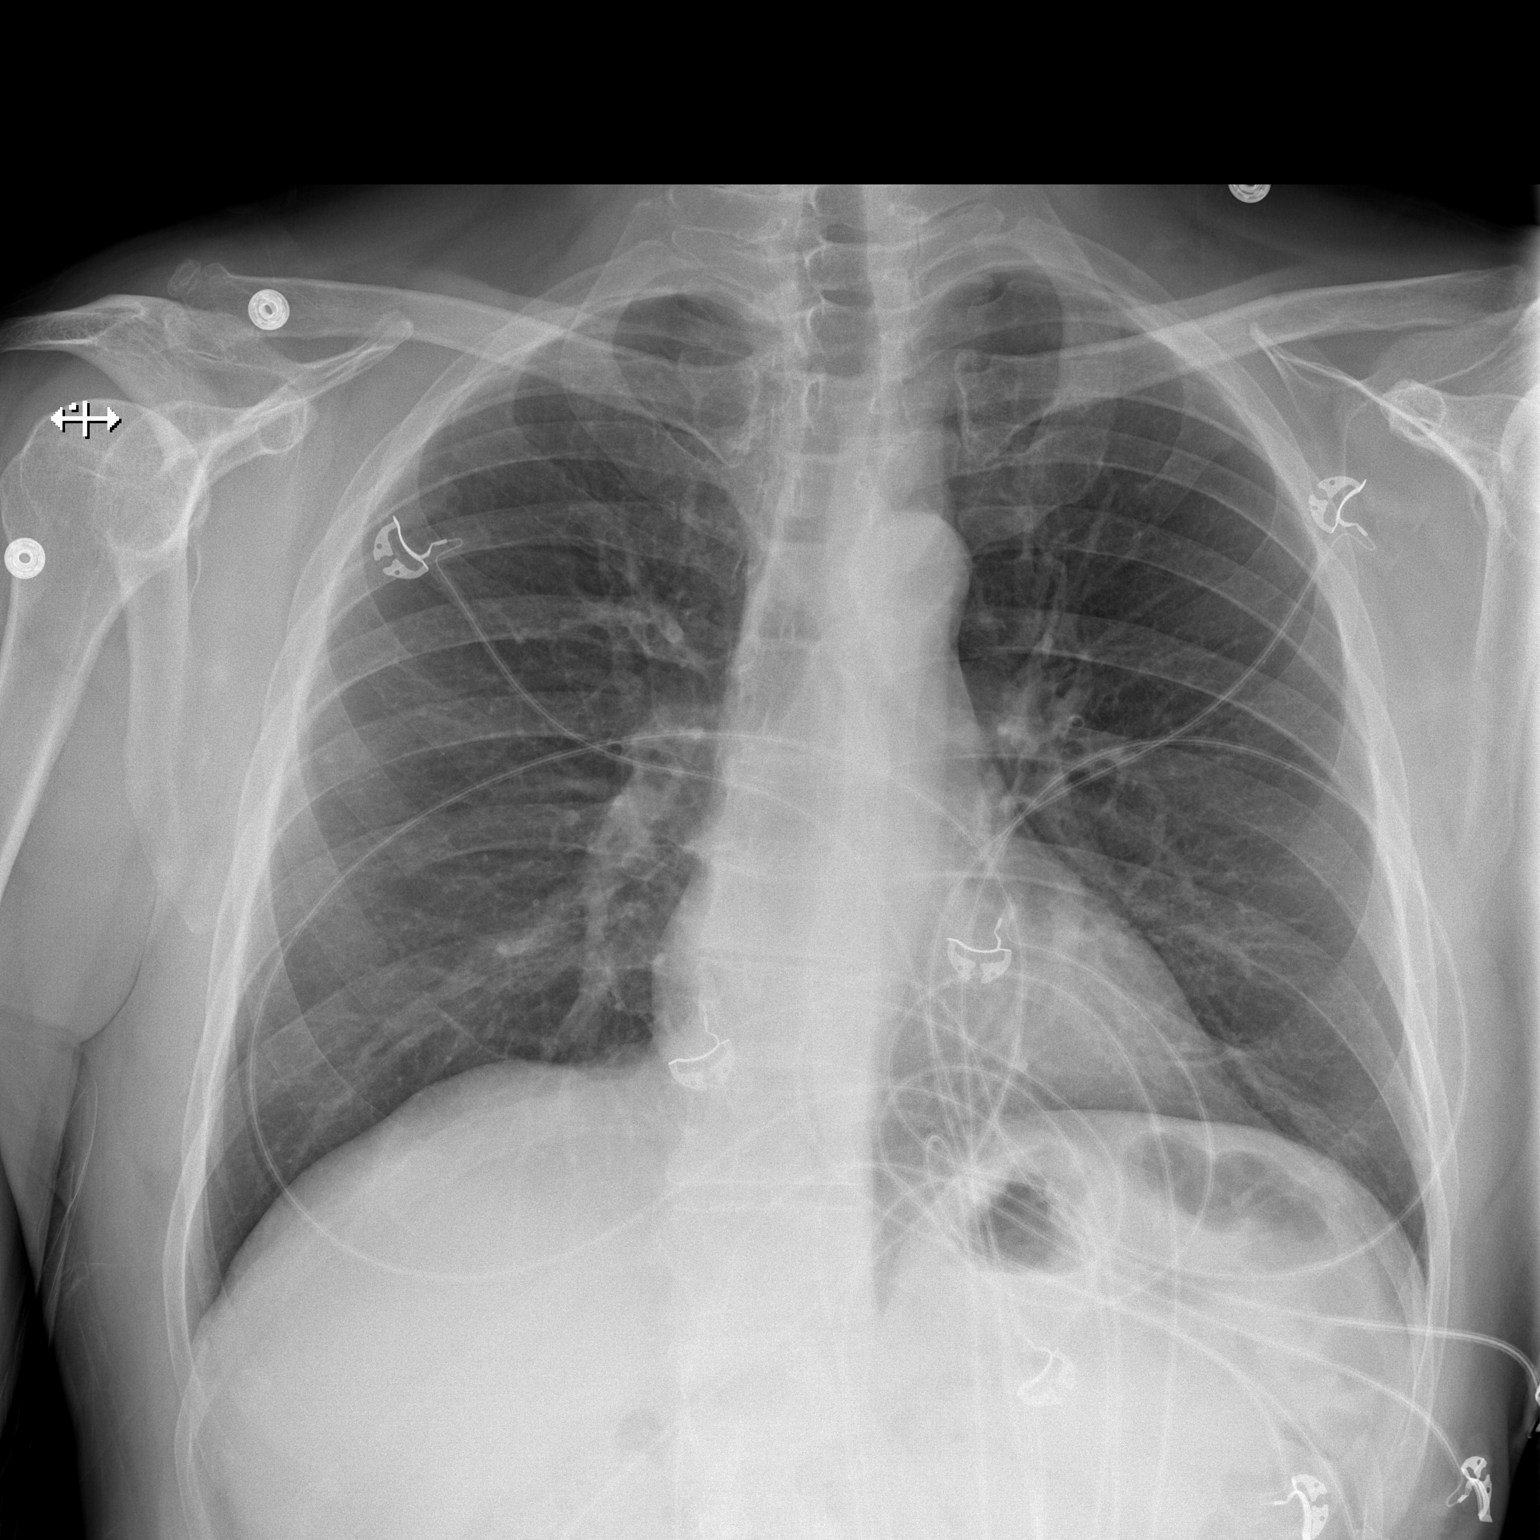

[w chest lat]
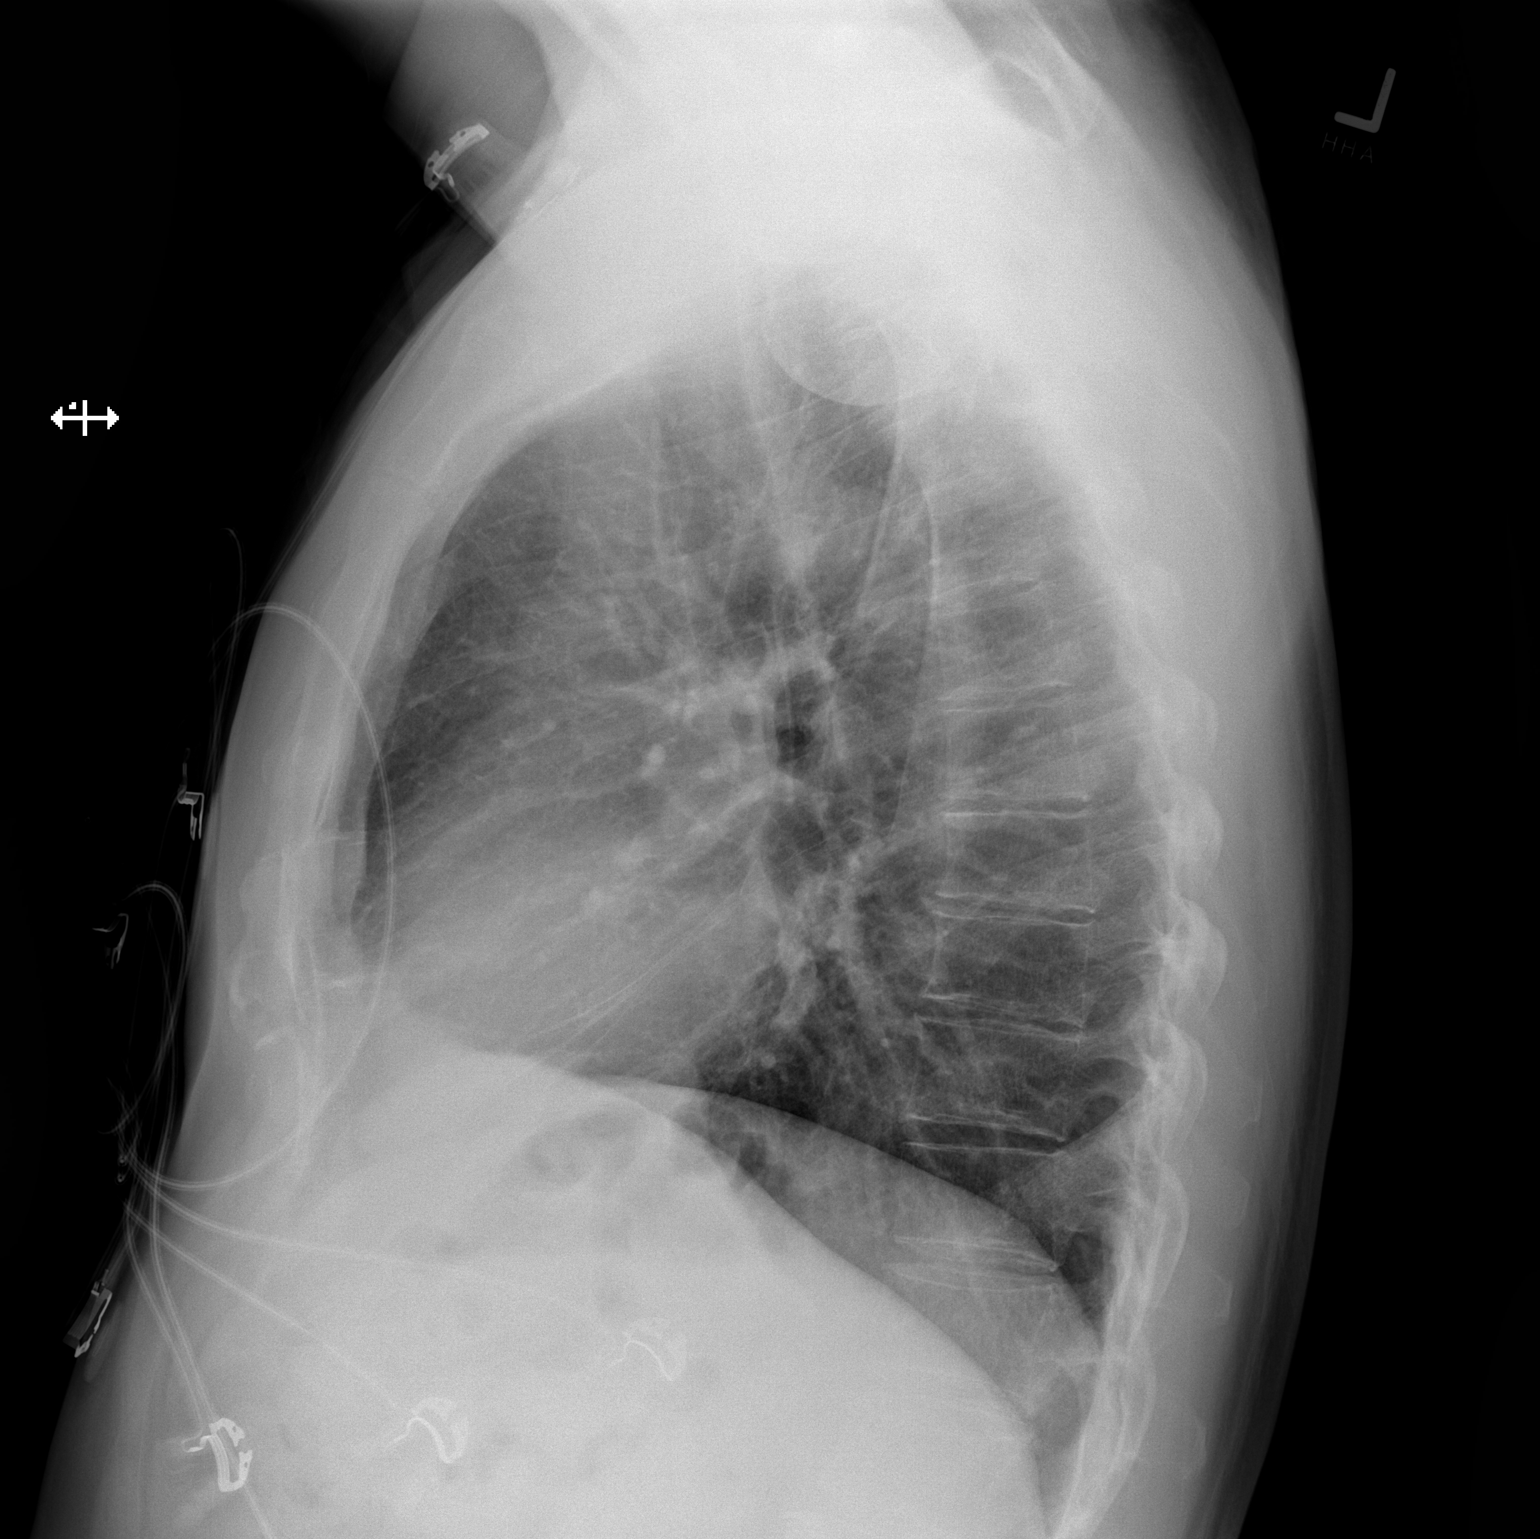

[2 of 2 positions shown; findings below may reference images not displayed]

FINDINGS: The heart size and mediastinal contours are within normal limits.
Both lungs are clear. The visualized skeletal structures are
unremarkable.
IMPRESSION: Negative two view chest x-ray

## 2020-08-26 DIAGNOSIS — Z136 Encounter for screening for cardiovascular disorders: Secondary | ICD-10-CM | POA: Diagnosis not present

## 2020-08-26 DIAGNOSIS — Z125 Encounter for screening for malignant neoplasm of prostate: Secondary | ICD-10-CM | POA: Diagnosis not present

## 2020-08-26 DIAGNOSIS — M25512 Pain in left shoulder: Secondary | ICD-10-CM | POA: Diagnosis not present

## 2020-08-26 DIAGNOSIS — Z79899 Other long term (current) drug therapy: Secondary | ICD-10-CM | POA: Diagnosis not present

## 2020-08-26 DIAGNOSIS — G43909 Migraine, unspecified, not intractable, without status migrainosus: Secondary | ICD-10-CM | POA: Diagnosis not present

## 2020-08-26 DIAGNOSIS — Z0000 Encounter for general adult medical examination without abnormal findings: Secondary | ICD-10-CM | POA: Diagnosis not present

## 2020-08-26 DIAGNOSIS — Z1159 Encounter for screening for other viral diseases: Secondary | ICD-10-CM | POA: Diagnosis not present

## 2020-09-05 DIAGNOSIS — H40013 Open angle with borderline findings, low risk, bilateral: Secondary | ICD-10-CM | POA: Diagnosis not present

## 2020-09-10 DIAGNOSIS — K219 Gastro-esophageal reflux disease without esophagitis: Secondary | ICD-10-CM | POA: Diagnosis not present

## 2020-09-10 DIAGNOSIS — R1319 Other dysphagia: Secondary | ICD-10-CM | POA: Diagnosis not present

## 2020-09-10 DIAGNOSIS — R1013 Epigastric pain: Secondary | ICD-10-CM | POA: Diagnosis not present

## 2020-09-25 ENCOUNTER — Other Ambulatory Visit: Payer: Self-pay | Admitting: Family Medicine

## 2020-09-25 DIAGNOSIS — U071 COVID-19: Secondary | ICD-10-CM | POA: Diagnosis not present

## 2020-09-25 DIAGNOSIS — Z87891 Personal history of nicotine dependence: Secondary | ICD-10-CM

## 2020-09-25 DIAGNOSIS — Z136 Encounter for screening for cardiovascular disorders: Secondary | ICD-10-CM

## 2020-10-10 DIAGNOSIS — H40013 Open angle with borderline findings, low risk, bilateral: Secondary | ICD-10-CM | POA: Diagnosis not present

## 2020-10-13 DIAGNOSIS — J019 Acute sinusitis, unspecified: Secondary | ICD-10-CM | POA: Diagnosis not present

## 2020-10-13 DIAGNOSIS — B9689 Other specified bacterial agents as the cause of diseases classified elsewhere: Secondary | ICD-10-CM | POA: Diagnosis not present

## 2020-10-20 ENCOUNTER — Ambulatory Visit
Admission: RE | Admit: 2020-10-20 | Discharge: 2020-10-20 | Disposition: A | Discharge status: Home / Self Care | Payer: Medicare Other | Source: Ambulatory Visit | Attending: Family Medicine | Admitting: Family Medicine

## 2020-10-20 DIAGNOSIS — Z136 Encounter for screening for cardiovascular disorders: Secondary | ICD-10-CM

## 2020-10-20 DIAGNOSIS — Z87891 Personal history of nicotine dependence: Secondary | ICD-10-CM

## 2020-10-22 ENCOUNTER — Telehealth: Payer: Medicare Other | Admitting: Physician Assistant

## 2020-10-22 DIAGNOSIS — Z20818 Contact with and (suspected) exposure to other bacterial communicable diseases: Secondary | ICD-10-CM | POA: Diagnosis not present

## 2020-10-22 DIAGNOSIS — J029 Acute pharyngitis, unspecified: Secondary | ICD-10-CM

## 2020-10-23 MED ORDER — AZITHROMYCIN 250 MG PO TABS
ORAL_TABLET | ORAL | 0 refills | Status: AC
Start: 2020-10-23 — End: 2020-10-28

## 2020-10-23 NOTE — Progress Notes (Signed)
E-Visit for Sore Throat - Strep Symptoms  We are sorry that you are not feeling well.  Here is how we plan to help!  Based on what you have shared with me it is likely that you have strep pharyngitis.  Strep pharyngitis is inflammation and infection in the back of the throat.  This is an infection cause by bacteria and is treated with antibiotics.  I have prescribed Azithromycin 250 mg two tablets today and then one daily for 4 additional days (since you were recently on Augmentin for a different infection). For throat pain, we recommend over the counter oral pain relief medications such as acetaminophen or aspirin, or anti-inflammatory medications such as ibuprofen or naproxen sodium. Topical treatments such as oral throat lozenges or sprays may be used as needed. Strep infections are not as easily transmitted as other respiratory infections, however we still recommend that you avoid close contact with loved ones, especially the very young and elderly.  Remember to wash your hands thoroughly throughout the day as this is the number one way to prevent the spread of infection and wipe down door knobs and counters with disinfectant.   Home Care: Only take medications as instructed by your medical team. Complete the entire course of an antibiotic. Do not take these medications with alcohol. A steam or ultrasonic humidifier can help congestion.  You can place a towel over your head and breathe in the steam from hot water coming from a faucet. Avoid close contacts especially the very young and the elderly. Cover your mouth when you cough or sneeze. Always remember to wash your hands.  Get Help Right Away If: You develop worsening fever or sinus pain. You develop a severe head ache or visual changes. Your symptoms persist after you have completed your treatment plan.  Make sure you Understand these instructions. Will watch your condition. Will get help right away if you are not doing well or get  worse.   Thank you for choosing an e-visit.  Your e-visit answers were reviewed by a board certified advanced clinical practitioner to complete your personal care plan. Depending upon the condition, your plan could have included both over the counter or prescription medications.  Please review your pharmacy choice. Make sure the pharmacy is open so you can pick up prescription now. If there is a problem, you may contact your provider through [employer] and have the prescription routed to another pharmacy.  Your safety is important to [place]. If you have drug allergies check your prescription carefully.   For the next 24 hours you can use MyChart to ask questions about today's visit, request a non-urgent call back, or ask for a work or school excuse. You will get an email in the next two days asking about your experience. I hope that your e-visit has been valuable and will speed your recovery.

## 2020-10-23 NOTE — Progress Notes (Signed)
I have spent 5 minutes in review of e-visit questionnaire, review and updating patient chart, medical decision making and response to patient.   Eily Louvier Cody Elica Almas, PA-C    

## 2020-11-21 DIAGNOSIS — R1310 Dysphagia, unspecified: Secondary | ICD-10-CM | POA: Diagnosis not present

## 2020-11-21 DIAGNOSIS — R1013 Epigastric pain: Secondary | ICD-10-CM | POA: Diagnosis not present

## 2020-11-21 DIAGNOSIS — K219 Gastro-esophageal reflux disease without esophagitis: Secondary | ICD-10-CM | POA: Diagnosis not present

## 2020-12-01 DIAGNOSIS — D225 Melanocytic nevi of trunk: Secondary | ICD-10-CM | POA: Diagnosis not present

## 2020-12-01 DIAGNOSIS — L570 Actinic keratosis: Secondary | ICD-10-CM | POA: Diagnosis not present

## 2020-12-01 DIAGNOSIS — L578 Other skin changes due to chronic exposure to nonionizing radiation: Secondary | ICD-10-CM | POA: Diagnosis not present

## 2021-02-05 DIAGNOSIS — R1013 Epigastric pain: Secondary | ICD-10-CM | POA: Diagnosis not present

## 2021-02-05 DIAGNOSIS — R1310 Dysphagia, unspecified: Secondary | ICD-10-CM | POA: Diagnosis not present

## 2021-02-05 DIAGNOSIS — K200 Eosinophilic esophagitis: Secondary | ICD-10-CM | POA: Diagnosis not present

## 2021-02-05 DIAGNOSIS — K2930 Chronic superficial gastritis without bleeding: Secondary | ICD-10-CM | POA: Diagnosis not present

## 2021-02-11 DIAGNOSIS — K200 Eosinophilic esophagitis: Secondary | ICD-10-CM | POA: Diagnosis not present

## 2021-02-11 DIAGNOSIS — K2930 Chronic superficial gastritis without bleeding: Secondary | ICD-10-CM | POA: Diagnosis not present

## 2021-02-12 DIAGNOSIS — N528 Other male erectile dysfunction: Secondary | ICD-10-CM | POA: Diagnosis not present

## 2021-02-12 DIAGNOSIS — N486 Induration penis plastica: Secondary | ICD-10-CM | POA: Diagnosis not present

## 2021-04-07 DIAGNOSIS — H40013 Open angle with borderline findings, low risk, bilateral: Secondary | ICD-10-CM | POA: Diagnosis not present

## 2021-04-23 DIAGNOSIS — H6981 Other specified disorders of Eustachian tube, right ear: Secondary | ICD-10-CM | POA: Diagnosis not present

## 2021-06-04 DIAGNOSIS — K2970 Gastritis, unspecified, without bleeding: Secondary | ICD-10-CM | POA: Diagnosis not present

## 2021-06-04 DIAGNOSIS — K2289 Other specified disease of esophagus: Secondary | ICD-10-CM | POA: Diagnosis not present

## 2021-06-04 DIAGNOSIS — K2930 Chronic superficial gastritis without bleeding: Secondary | ICD-10-CM | POA: Diagnosis not present

## 2021-06-04 DIAGNOSIS — K200 Eosinophilic esophagitis: Secondary | ICD-10-CM | POA: Diagnosis not present

## 2021-06-10 DIAGNOSIS — K2930 Chronic superficial gastritis without bleeding: Secondary | ICD-10-CM | POA: Diagnosis not present

## 2021-06-10 DIAGNOSIS — K2289 Other specified disease of esophagus: Secondary | ICD-10-CM | POA: Diagnosis not present

## 2021-08-06 DIAGNOSIS — N3000 Acute cystitis without hematuria: Secondary | ICD-10-CM | POA: Diagnosis not present

## 2021-08-06 DIAGNOSIS — R300 Dysuria: Secondary | ICD-10-CM | POA: Diagnosis not present

## 2021-09-11 DIAGNOSIS — R197 Diarrhea, unspecified: Secondary | ICD-10-CM | POA: Diagnosis not present

## 2021-09-14 DIAGNOSIS — U071 COVID-19: Secondary | ICD-10-CM | POA: Diagnosis not present

## 2021-09-28 ENCOUNTER — Other Ambulatory Visit (HOSPITAL_BASED_OUTPATIENT_CLINIC_OR_DEPARTMENT_OTHER): Payer: Self-pay

## 2021-09-29 DIAGNOSIS — K2930 Chronic superficial gastritis without bleeding: Secondary | ICD-10-CM | POA: Diagnosis not present

## 2021-09-29 DIAGNOSIS — Z125 Encounter for screening for malignant neoplasm of prostate: Secondary | ICD-10-CM | POA: Diagnosis not present

## 2021-09-29 DIAGNOSIS — B009 Herpesviral infection, unspecified: Secondary | ICD-10-CM | POA: Diagnosis not present

## 2021-09-29 DIAGNOSIS — G43909 Migraine, unspecified, not intractable, without status migrainosus: Secondary | ICD-10-CM | POA: Diagnosis not present

## 2021-09-29 DIAGNOSIS — Z0001 Encounter for general adult medical examination with abnormal findings: Secondary | ICD-10-CM | POA: Diagnosis not present

## 2021-09-29 DIAGNOSIS — Z79899 Other long term (current) drug therapy: Secondary | ICD-10-CM | POA: Diagnosis not present

## 2021-09-29 DIAGNOSIS — E7800 Pure hypercholesterolemia, unspecified: Secondary | ICD-10-CM | POA: Diagnosis not present

## 2021-10-21 DIAGNOSIS — R151 Fecal smearing: Secondary | ICD-10-CM | POA: Diagnosis not present

## 2021-11-02 DIAGNOSIS — H40013 Open angle with borderline findings, low risk, bilateral: Secondary | ICD-10-CM | POA: Diagnosis not present

## 2021-11-24 ENCOUNTER — Other Ambulatory Visit: Payer: Self-pay

## 2021-11-24 ENCOUNTER — Ambulatory Visit: Payer: Medicare Other | Attending: Family Medicine | Admitting: Physical Therapy

## 2021-11-24 DIAGNOSIS — R279 Unspecified lack of coordination: Secondary | ICD-10-CM | POA: Insufficient documentation

## 2021-11-24 DIAGNOSIS — R293 Abnormal posture: Secondary | ICD-10-CM | POA: Insufficient documentation

## 2021-11-24 DIAGNOSIS — M6281 Muscle weakness (generalized): Secondary | ICD-10-CM | POA: Insufficient documentation

## 2021-11-24 NOTE — Therapy (Signed)
OUTPATIENT PHYSICAL THERAPY MALE PELVIC EVALUATION   Patient Name: Harold Sherman MRN: [id number] DOB:[birth year], 66 y.o., male Today's Date: 11/24/2021  END OF SESSION:  PT End of Session - 11/24/21 1558     Visit Number 1    Date for PT Re-Evaluation 02/24/22    Authorization Type BCBS    PT Start Time 1400    PT Stop Time 1440    PT Time Calculation (min) 40 min    Activity Tolerance Patient tolerated treatment well    Behavior During Therapy WFL for tasks assessed/performed             Past Medical History:  Diagnosis Date   Migraine    TIA (transient ischemic attack)    Past Surgical History:  Procedure Laterality Date   APPENDECTOMY     TONSILLECTOMY     Patient Active Problem List   Diagnosis Date Noted   Hernia of abdominal cavity 12/14/2011   FUO (fever of unknown origin) 11/17/2011    PCP: not listed per chart  REFERRING PROVIDER: R15.9 (ICD-10-CM) - Full incontinence of feces  REFERRING DIAG: Koirala, Dibas, MD   THERAPY DIAG:  Muscle weakness (generalized)  Abnormal posture  Unspecified lack of coordination  Rationale for Evaluation and Treatment: Rehabilitation  ONSET DATE: 6-8 months has worsened but started in 2019 after a 13 hour flight and then had low back pain and pain with sitting but treated pain with PT and medical needs and this has been better.   SUBJECTIVE:                                                                                                                                                                                           SUBJECTIVE STATEMENT: Pt reports when he passes gas liquid stool leakage occurs. Has tried metamucil and this helps some, probiotics for the last two weeks and hasn't seen a huge difference yet. Also has had softer stools and in pieces and until recently has been going daily, now going daily but sometimes going 2-3x in the morning instead of once.    PAIN:  Are you having pain? No NPRS  scale: 4-8/10 Pain location:  Lt medial gluteal   Pain type: sore/achy Pain description:  on with sitting     Aggravating factors: sitting Relieving factors: walking  PRECAUTIONS: None  WEIGHT BEARING RESTRICTIONS: No  FALLS:  Has patient fallen in last 6 months? No  LIVING ENVIRONMENT: Lives with: lives with their family Lives in: House/apartment   OCCUPATION: retired   PLOF: Independent  PATIENT GOALS: to have more regular bowels and less leakage  PERTINENT HISTORY:  Migraine, hernia, TIA Sexual abuse:NO   BOWEL MOVEMENT: Pain with bowel movement: No Type of bowel movement:Type (Bristol Stool Scale) 4-6, Frequency daily, and Strain No Fully empty rectum: Yes: feels like it but then about an hour later with need to return and empty again, 2-3 times Leakage: Yes: always liquid when happening and with passing gas, notices an increase with working out Pads: No Fiber supplement: Yes: metamucil  URINATION: Pain with urination: No Fully empty bladder: Yes:   Stream: Strong Urgency: No Frequency: not quicker than every 2 hours Leakage:  none Pads: No  INTERCOURSE: Pain with intercourse:  not painful    OBJECTIVE:   DIAGNOSTIC FINDINGS:    COGNITION: Overall cognitive status: Within functional limits for tasks assessed     SENSATION: Light touch: Appears intact Proprioception: Appears intact  MUSCLE LENGTH: Bil hamstrings and adductors limited by 25%   POSTURE: rounded shoulders and forward head    LUMBARAROM/PROM:  A/PROM A/PROM  eval  Flexion Limited by 25%  Extension WFL  Right lateral flexion Limited by 25%  Left lateral flexion Limited by 25%  Right rotation Limited by 25%  Left rotation Limited by 25%   (Blank rows = not tested)  LOWER EXTREMITY AROM/PROM:  [facility]  LOWER EXTREMITY MMT:  Bil hips grossly 4/5, knees and ankles 5/5  PALPATION: GENERAL no TTP but Lt abdominal fascial tightness throughout lt side               External Perineal Exam redness noted around EAS but no pain              Internal Pelvic Floor no TTP Patient confirms identification and approves PT to assess internal pelvic floor and treatment Yes  PELVIC MMT:   MMT eval  Internal Anal Sphincter 5/5, 30s, 10 reps  External Anal Sphincter   Puborectalis   Diastasis Recti   (Blank rows = not tested)  TONE: WFL  TODAY'S TREATMENT:                                                                                                                              DATE: 11/24/21   EVAL Examination completed, findings reviewed, pt educated on POC, HEP, abdominal massage, voiding mechanics, and fiber types . Pt motivated to participate in PT and agreeable to attempt recommendations.     PATIENT EDUCATION:  Education details: [phone] Person educated: Patient Education method: Explanation, Demonstration, Tactile cues, Verbal cues, and Handouts Education comprehension: verbalized understanding and returned demonstration  HOME EXERCISE PROGRAM: [id number]  ASSESSMENT:  CLINICAL IMPRESSION: Patient is a 66 y.o. male  who was seen today for physical therapy evaluation and treatment for fecal incontinence with passing gas.pt also has pain with sitting at Lt gluteal. Pt found to have decreased flexibility of spine and hips, fascial restrictions in Lt abdominal quadrants, no pain throughout. Pt consented to internal rectal assessment this date and found to have mild decreased coordination with bulge  and noted initially full contraction with cues then demonstrated ability to fully bulge. Pt able to complete this x3 after this without cues. Pt educated on voiding mechanics, fiber types, abdominal massage, and HEP. Pt would benefit from additional PT to further address deficits.    OBJECTIVE IMPAIRMENTS: decreased coordination, increased fascial restrictions, impaired flexibility, improper body mechanics, postural dysfunction, and pain.   ACTIVITY  LIMITATIONS: lifting, squatting, and continence  PARTICIPATION LIMITATIONS: community activity  PERSONAL FACTORS: Time since onset of injury/illness/exacerbation are also affecting patient's functional outcome.   REHAB POTENTIAL: Good  CLINICAL DECISION MAKING: Stable/uncomplicated  EVALUATION COMPLEXITY: Low   GOALS: Goals reviewed with patient? Yes  SHORT TERM GOALS: Target date: 12/23/22  Pt to be I with HEP.  Baseline Goal status: INITIAL  2.  Pt will report her BMs are complete at least 75% of the time due to improved bowel habits and evacuation techniques.  Baseline:  Goal status: INITIAL  3.  Pt to be I with voiding and breathing mechanics for improved bowel habits.  Baseline:  Goal status: INITIAL   LONG TERM GOALS: Target date: 02/24/22  Pt to be I with advanced HEP.  Baseline:  Goal status: INITIAL  2.  Pt to report no more than 2 bowel movement per day for improved voiding habits.  Baseline:  Goal status: INITIAL  3.  Pt to report no more than 1 instance of fecal leakage in one month due to improved voiding habits and decreased strain at pelvic floor.  Baseline:  Goal status: INITIAL  PLAN:  PT FREQUENCY: every other week  PT DURATION:  4 sessions  PLANNED INTERVENTIONS: Therapeutic exercises, Therapeutic activity, Neuromuscular re-education, Patient/Family education, Self Care, Joint mobilization, Dry Needling, Electrical stimulation, Spinal mobilization, Cryotherapy, Moist heat, Biofeedback, and Manual therapy  PLAN FOR NEXT SESSION: coordination of pelvic floor and breathing with strengthening exercises, voiding mechanics, go over handouts as needed.   Otelia Sergeant, PT, DPT 11/21/233:58 PM

## 2021-11-24 NOTE — Patient Instructions (Addendum)
Types of Fiber  There are two main types of fiber:  insoluble and soluble.  Both of these types can prevent and relieve constipation and diarrhea, although some people find one or the other to be more easily digested.  This handout details information about both types of fiber. recommended 25-35 grams of fiber per day,  average 9-12 grams per meal   key is a balance between soluble and insoluble  Insoluble Fiber        Functions of Insoluble Fiber moves bulk through the intestines  controls and balances the pH (acidity) in the intestines   This type of fiber should be avoided or reduced if you have soft, frequent bowel movements or leakage      Benefits of Insoluble Fiber promotes regular bowel movement and prevents constipation  removes fecal waste through colon in less time  keeps an optimal pH in intestines to prevent microbes from producing cancer substances, therefore preventing colon cancer        Food Sources of Insoluble Fiber whole-wheat products  wheat bran "miller's bran" corn bran  flax seed or other seeds vegetables such as green beans, broccoli, cauliflower and potato skins  fruit skins and root vegetable skins  popcorn brown rice  Soluble Fiber( Types 5,6,7)       Functions of Soluble Fiber  holds water in the colon to bulk and soften the stool prolongs stomach emptying time so that sugar is released and absorbed more slowly  prevent leakage associated with soft, frequent bowel movements.        Benefits of Soluble Fiber lowers total cholesterol and LDL cholesterol (the bad cholesterol) therefore reducing the risk of heart disease  regulates blood sugar for people with diabetes       Food Sources of Soluble Fiber oat/oat bran dried beans and peas  nuts  barley  flax seed or other seeds fruits such as oranges, pears, peaches, and apples  vegetables such as carrots  psyllium husk  prunes  

## 2021-12-02 DIAGNOSIS — L578 Other skin changes due to chronic exposure to nonionizing radiation: Secondary | ICD-10-CM | POA: Diagnosis not present

## 2021-12-02 DIAGNOSIS — D225 Melanocytic nevi of trunk: Secondary | ICD-10-CM | POA: Diagnosis not present

## 2021-12-02 DIAGNOSIS — L814 Other melanin hyperpigmentation: Secondary | ICD-10-CM | POA: Diagnosis not present

## 2021-12-05 DIAGNOSIS — H524 Presbyopia: Secondary | ICD-10-CM | POA: Diagnosis not present

## 2021-12-09 ENCOUNTER — Ambulatory Visit: Payer: Medicare Other | Admitting: Physical Therapy

## 2021-12-22 ENCOUNTER — Ambulatory Visit: Payer: Medicare Other | Attending: Family Medicine | Admitting: Physical Therapy

## 2021-12-22 ENCOUNTER — Encounter: Payer: Self-pay | Admitting: Physical Therapy

## 2021-12-22 DIAGNOSIS — R293 Abnormal posture: Secondary | ICD-10-CM | POA: Insufficient documentation

## 2021-12-22 DIAGNOSIS — M6281 Muscle weakness (generalized): Secondary | ICD-10-CM | POA: Diagnosis not present

## 2021-12-22 DIAGNOSIS — R279 Unspecified lack of coordination: Secondary | ICD-10-CM | POA: Insufficient documentation

## 2021-12-22 NOTE — Therapy (Signed)
OUTPATIENT PHYSICAL THERAPY MALE PELVIC TREATMENT   Patient Name: Harold Sherman MRN: [id number] DOB:1955-03-03, 66 y.o., male Today's Date: 12/22/2021  END OF SESSION:  PT End of Session - 12/22/21 0835     Visit Number 2    Date for PT Re-Evaluation 02/24/22    Authorization Type BCBS    PT Start Time 0840    PT Stop Time 0920    PT Time Calculation (min) 40 min    Activity Tolerance Patient tolerated treatment well    Behavior During Therapy WFL for tasks assessed/performed             Past Medical History:  Diagnosis Date   Migraine    TIA (transient ischemic attack)    Past Surgical History:  Procedure Laterality Date   APPENDECTOMY     TONSILLECTOMY     Patient Active Problem List   Diagnosis Date Noted   Hernia of abdominal cavity 12/14/2011   FUO (fever of unknown origin) 11/17/2011    PCP: not listed per chart  REFERRING PROVIDER: R15.9 (ICD-10-CM) - Full incontinence of feces  REFERRING DIAG: Koirala, Dibas, MD   THERAPY DIAG:  Muscle weakness (generalized)  Abnormal posture  Unspecified lack of coordination  Rationale for Evaluation and Treatment: Rehabilitation  ONSET DATE: 6-8 months has worsened but started in 2019 after a 13 hour flight and then had low back pain and pain with sitting but treated pain with PT and medical needs and this has been better.   SUBJECTIVE:                                                                                                                                                                                           SUBJECTIVE STATEMENT: Pt reports he has been doing HEP, still has fecal leakage same as eval. Reports usually about 5x a week will have wet/liquid fecal leakage with passing gas and walking on the treadmill.   PAIN:  Are you having pain? No  PRECAUTIONS: None  WEIGHT BEARING RESTRICTIONS: No  FALLS:  Has patient fallen in last 6 months? No  LIVING ENVIRONMENT: Lives with: lives with  their family Lives in: House/apartment   OCCUPATION: retired   PLOF: Independent  PATIENT GOALS: to have more regular bowels and less leakage  PERTINENT HISTORY:  Migraine, hernia, TIA Sexual abuse:NO   BOWEL MOVEMENT: Pain with bowel movement: No Type of bowel movement:Type (Bristol Stool Scale) 4-6, Frequency daily, and Strain No Fully empty rectum: Yes: feels like it but then about an hour later with need to return and empty again, 2-3 times Leakage: Yes: always liquid when happening  and with passing gas, notices an increase with working out Pads: No Fiber supplement: Yes: metamucil  URINATION: Pain with urination: No Fully empty bladder: Yes:   Stream: Strong Urgency: No Frequency: not quicker than every 2 hours Leakage:  none Pads: No  INTERCOURSE: Pain with intercourse:  not painful    OBJECTIVE:   DIAGNOSTIC FINDINGS:    COGNITION: Overall cognitive status: Within functional limits for tasks assessed     SENSATION: Light touch: Appears intact Proprioception: Appears intact  MUSCLE LENGTH: Bil hamstrings and adductors limited by 25%   POSTURE: rounded shoulders and forward head    LUMBARAROM/PROM:  A/PROM A/PROM  eval  Flexion Limited by 25%  Extension WFL  Right lateral flexion Limited by 25%  Left lateral flexion Limited by 25%  Right rotation Limited by 25%  Left rotation Limited by 25%   (Blank rows = not tested)  LOWER EXTREMITY AROM/PROM:  [facility]  LOWER EXTREMITY MMT:  Bil hips grossly 4/5, knees and ankles 5/5  PALPATION: GENERAL no TTP but Lt abdominal fascial tightness throughout lt side              External Perineal Exam redness noted around EAS but no pain              Internal Pelvic Floor no TTP Patient confirms identification and approves PT to assess internal pelvic floor and treatment Yes  PELVIC MMT:   MMT eval  Internal Anal Sphincter 5/5, 30s, 10 reps  External Anal Sphincter   Puborectalis   Diastasis  Recti   (Blank rows = not tested)  TONE: WFL  TODAY'S TREATMENT:                                                                                                                              DATE:  12/22/21  NMRE: all exercises cued for coordination of pelvic floor and breathing mechanics for decreased pressure at pelvic floor and decreased leakage. Bridges black band 2x10 Bird dogs x10 each, x10 3# 15# Sit to stand from mat table 2x10 Standing 15# OHP with alt march 2x10 Standing diagonals cross body 10# 2x10 Standing lateral steps on bosu ball 2x10 each   PATIENT EDUCATION:  Education details: [id number] Person educated: Patient Education method: Explanation, Demonstration, Tactile cues, Verbal cues, and Handouts Education comprehension: verbalized understanding and returned demonstration  HOME EXERCISE PROGRAM: [email or web address]  ASSESSMENT:  CLINICAL IMPRESSION: Patient session focused on hip and core strengthening with emphasis being on NMRE for coordination of pelvic floor and breathing mechanics for decreased pressure at pelvic floor and decreased leakage. Pt tolerated well but does benefit from moderate verbal cues for improved technique and coordination. Pt would benefit from additional PT to further address deficits.    OBJECTIVE IMPAIRMENTS: decreased coordination, increased fascial restrictions, impaired flexibility, improper body mechanics, postural dysfunction, and pain.   ACTIVITY LIMITATIONS: lifting, squatting, and continence  PARTICIPATION LIMITATIONS: community activity  PERSONAL FACTORS: Time since onset  of injury/illness/exacerbation are also affecting patient's functional outcome.   REHAB POTENTIAL: Good  CLINICAL DECISION MAKING: Stable/uncomplicated  EVALUATION COMPLEXITY: Low   GOALS: Goals reviewed with patient? Yes  SHORT TERM GOALS: Target date: 12/23/22  Pt to be I with HEP.  Baseline Goal status: INITIAL  2.  Pt will report her BMs are  complete at least 75% of the time due to improved bowel habits and evacuation techniques.  Baseline:  Goal status: INITIAL  3.  Pt to be I with voiding and breathing mechanics for improved bowel habits.  Baseline:  Goal status: INITIAL   LONG TERM GOALS: Target date: 02/24/22  Pt to be I with advanced HEP.  Baseline:  Goal status: INITIAL  2.  Pt to report no more than 2 bowel movement per day for improved voiding habits.  Baseline:  Goal status: INITIAL  3.  Pt to report no more than 1 instance of fecal leakage in one month due to improved voiding habits and decreased strain at pelvic floor.  Baseline:  Goal status: INITIAL  PLAN:  PT FREQUENCY: every other week  PT DURATION:  4 sessions  PLANNED INTERVENTIONS: Therapeutic exercises, Therapeutic activity, Neuromuscular re-education, Patient/Family education, Self Care, Joint mobilization, Dry Needling, Electrical stimulation, Spinal mobilization, Cryotherapy, Moist heat, Biofeedback, and Manual therapy  PLAN FOR NEXT SESSION: coordination of pelvic floor and breathing with strengthening exercises, voiding mechanics, go over handouts as needed.   Otelia Sergeant, PT, DPT 12/19/239:25 AM

## 2022-01-05 ENCOUNTER — Ambulatory Visit: Payer: Medicare Other | Attending: Family Medicine | Admitting: Physical Therapy

## 2022-01-05 DIAGNOSIS — R293 Abnormal posture: Secondary | ICD-10-CM

## 2022-01-05 DIAGNOSIS — M6281 Muscle weakness (generalized): Secondary | ICD-10-CM

## 2022-01-05 DIAGNOSIS — R279 Unspecified lack of coordination: Secondary | ICD-10-CM

## 2022-01-05 NOTE — Therapy (Signed)
OUTPATIENT PHYSICAL THERAPY MALE PELVIC TREATMENT   Patient Name: Harold Sherman MRN: [id number] DOB:1955-03-15, 67 y.o., male Today's Date: 01/05/2022  END OF SESSION:  PT End of Session - 01/05/22 0803     Visit Number 3    Date for PT Re-Evaluation 02/24/22    Authorization Type BCBS    PT Start Time 0801    PT Stop Time 0843    PT Time Calculation (min) 42 min    Activity Tolerance Patient tolerated treatment well    Behavior During Therapy WFL for tasks assessed/performed             Past Medical History:  Diagnosis Date   Migraine    TIA (transient ischemic attack)    Past Surgical History:  Procedure Laterality Date   APPENDECTOMY     TONSILLECTOMY     Patient Active Problem List   Diagnosis Date Noted   Hernia of abdominal cavity 12/14/2011   FUO (fever of unknown origin) 11/17/2011    PCP: not listed per chart  REFERRING PROVIDER: R15.9 (ICD-10-CM) - Full incontinence of feces  REFERRING DIAG: Koirala, Dibas, MD   THERAPY DIAG:  Muscle weakness (generalized)  Abnormal posture  Unspecified lack of coordination  Rationale for Evaluation and Treatment: Rehabilitation  ONSET DATE: 6-8 months has worsened but started in 2019 after a 13 hour flight and then had low back pain and pain with sitting but treated pain with PT and medical needs and this has been better.   SUBJECTIVE:                                                                                                                                                                                           SUBJECTIVE STATEMENT: Pt reports he is still having leakage now about 4 days a week. Sometimes has it after treadmill and will notice leakage afterward but others will just notice leakage during the day in small amounts due to irritation at rectum. Then other times will have a few days without any leakage. Does have regular bowel movements with urges and no leakage, reports abdominal massage and  step stool help with regularity and fully emptying but not leakage. Usually type 4-5.   PAIN:  Are you having pain? No  PRECAUTIONS: None  WEIGHT BEARING RESTRICTIONS: No  FALLS:  Has patient fallen in last 6 months? No  LIVING ENVIRONMENT: Lives with: lives with their family Lives in: House/apartment   OCCUPATION: retired   PLOF: Independent  PATIENT GOALS: to have more regular bowels and less leakage  PERTINENT HISTORY:  Migraine, hernia, TIA Sexual abuse:NO   BOWEL MOVEMENT:  Pain with bowel movement: No Type of bowel movement:Type (Bristol Stool Scale) 4-6, Frequency daily, and Strain No Fully empty rectum: Yes: feels like it but then about an hour later with need to return and empty again, 2-3 times Leakage: Yes: always liquid when happening and with passing gas, notices an increase with working out Pads: No Fiber supplement: Yes: metamucil  URINATION: Pain with urination: No Fully empty bladder: Yes:   Stream: Strong Urgency: No Frequency: not quicker than every 2 hours Leakage:  none Pads: No  INTERCOURSE: Pain with intercourse:  not painful    OBJECTIVE:   DIAGNOSTIC FINDINGS:    COGNITION: Overall cognitive status: Within functional limits for tasks assessed     SENSATION: Light touch: Appears intact Proprioception: Appears intact  MUSCLE LENGTH: Bil hamstrings and adductors limited by 25%   POSTURE: rounded shoulders and forward head    LUMBARAROM/PROM:  A/PROM A/PROM  eval  Flexion Limited by 25%  Extension WFL  Right lateral flexion Limited by 25%  Left lateral flexion Limited by 25%  Right rotation Limited by 25%  Left rotation Limited by 25%   (Blank rows = not tested)  LOWER EXTREMITY AROM/PROM:  [facility]  LOWER EXTREMITY MMT:  Bil hips grossly 4/5, knees and ankles 5/5  PALPATION: GENERAL no TTP but Lt abdominal fascial tightness throughout lt side              External Perineal Exam redness noted around EAS but no  pain              Internal Pelvic Floor no TTP Patient confirms identification and approves PT to assess internal pelvic floor and treatment Yes  PELVIC MMT:   MMT eval  Internal Anal Sphincter 5/5, 30s, 10 reps  External Anal Sphincter   Puborectalis   Diastasis Recti   (Blank rows = not tested)  TONE: WFL  TODAY'S TREATMENT:                                                                                                                              DATE:   01/05/22  NMRE: all exercises cued for coordination of pelvic floor and breathing mechanics for decreased pressure at pelvic floor and decreased leakage. Bridges with ball squeezes 2x10 Bird dogs x10 each, blue band  Bear plank 2x10 Donkey kicks red loop 2x10 Fire hydrants 2x10 red loop Roller then attempted double tension ball at lt gluteal for improved muscle mobility and decreased tension noted at Lt piriformis  Addaday used with pt in sidelying L2 at bil sides of coccyx with pt reporting improved mobility and feeling less tight here.    PATIENT EDUCATION:  Education details: [phone] Person educated: Patient Education method: Explanation, Demonstration, Tactile cues, Verbal cues, and Handouts Education comprehension: verbalized understanding and returned demonstration  HOME EXERCISE PROGRAM: [id number]  ASSESSMENT:  CLINICAL IMPRESSION: Patient session focused on hip and core strengthening with emphasis being on NMRE for coordination of  pelvic floor and breathing mechanics for decreased pressure at pelvic floor and decreased leakage. Pt tolerated well but does benefit from minimal verbal cues for improved technique and coordination. Session also implemented mobility at Lt gluteal and coccyx as pt reported during session he has been paying more attention and notes when he has more pain in this area he will have a "heavier" leakage day. Pt does have tightness at bil sides of coccyx and TTP at Lt side, tension also felt  throughout bil gluteals. Pt tolerated this well and reported feeling better at end of session. Pt would benefit from additional PT to further address deficits.    OBJECTIVE IMPAIRMENTS: decreased coordination, increased fascial restrictions, impaired flexibility, improper body mechanics, postural dysfunction, and pain.   ACTIVITY LIMITATIONS: lifting, squatting, and continence  PARTICIPATION LIMITATIONS: community activity  PERSONAL FACTORS: Time since onset of injury/illness/exacerbation are also affecting patient's functional outcome.   REHAB POTENTIAL: Good  CLINICAL DECISION MAKING: Stable/uncomplicated  EVALUATION COMPLEXITY: Low   GOALS: Goals reviewed with patient? Yes  SHORT TERM GOALS: Target date: 12/23/22  Pt to be I with HEP.  Baseline Goal status: MET  2.  Pt will report her BMs are complete at least 75% of the time due to improved bowel habits and evacuation techniques.  Baseline:  Goal status: MET  3.  Pt to be I with voiding and breathing mechanics for improved bowel habits.  Baseline:  Goal status: MET   LONG TERM GOALS: Target date: 02/24/22  Pt to be I with advanced HEP.  Baseline:  Goal status: INITIAL  2.  Pt to report no more than 2 bowel movement per day for improved voiding habits.  Baseline:  Goal status: MET - 1 most of the time, sometimes 2  3.  Pt to report no more than 1 instance of fecal leakage in one month due to improved voiding habits and decreased strain at pelvic floor.  Baseline:  Goal status: INITIAL  PLAN:  PT FREQUENCY: every other week  PT DURATION:  4 sessions  PLANNED INTERVENTIONS: Therapeutic exercises, Therapeutic activity, Neuromuscular re-education, Patient/Family education, Self Care, Joint mobilization, Dry Needling, Electrical stimulation, Spinal mobilization, Cryotherapy, Moist heat, Biofeedback, and Manual therapy  PLAN FOR NEXT SESSION: coordination of pelvic floor and breathing with strengthening exercises,  voiding mechanics, go over handouts as needed.   Stacy Gardner, PT, DPT 01/02/248:44 AM

## 2022-01-11 DIAGNOSIS — K080 Exfoliation of teeth due to systemic causes: Secondary | ICD-10-CM | POA: Diagnosis not present

## 2022-01-12 DIAGNOSIS — K080 Exfoliation of teeth due to systemic causes: Secondary | ICD-10-CM | POA: Diagnosis not present

## 2022-01-19 ENCOUNTER — Encounter: Payer: Medicare Other | Admitting: Physical Therapy

## 2022-01-19 DIAGNOSIS — R159 Full incontinence of feces: Secondary | ICD-10-CM | POA: Diagnosis not present

## 2022-01-20 ENCOUNTER — Encounter: Payer: Self-pay | Admitting: Physical Therapy

## 2022-01-20 ENCOUNTER — Ambulatory Visit: Payer: Medicare Other | Admitting: Physical Therapy

## 2022-01-20 DIAGNOSIS — R279 Unspecified lack of coordination: Secondary | ICD-10-CM

## 2022-01-20 DIAGNOSIS — R293 Abnormal posture: Secondary | ICD-10-CM

## 2022-01-20 DIAGNOSIS — M6281 Muscle weakness (generalized): Secondary | ICD-10-CM | POA: Diagnosis not present

## 2022-01-20 NOTE — Therapy (Signed)
OUTPATIENT PHYSICAL THERAPY MALE PELVIC TREATMENT   Patient Name: Harold Sherman MRN: [id number] DOB:[birth year], 67 y.o., male Today's Date: 01/20/2022  END OF SESSION:  PT End of Session - 01/20/22 0857     Visit Number 4    Date for PT Re-Evaluation 02/24/22    Authorization Type BCBS    PT Start Time 0802    PT Stop Time 0845    PT Time Calculation (min) 43 min    Activity Tolerance Patient tolerated treatment well    Behavior During Therapy WFL for tasks assessed/performed              Past Medical History:  Diagnosis Date   Migraine    TIA (transient ischemic attack)    Past Surgical History:  Procedure Laterality Date   APPENDECTOMY     TONSILLECTOMY     Patient Active Problem List   Diagnosis Date Noted   Hernia of abdominal cavity 12/14/2011   FUO (fever of unknown origin) 11/17/2011    PCP: not listed per chart  REFERRING PROVIDER: R15.9 (ICD-10-CM) - Full incontinence of feces  REFERRING DIAG: Koirala, Dibas, MD   THERAPY DIAG:  Muscle weakness (generalized)  Abnormal posture  Unspecified lack of coordination  Rationale for Evaluation and Treatment: Rehabilitation  ONSET DATE: 6-8 months has worsened but started in 2019 after a 13 hour flight and then had low back pain and pain with sitting but treated pain with PT and medical needs and this has been better.   SUBJECTIVE:                                                                                                                                                                                           SUBJECTIVE STATEMENT: Pt reports since doing the massage with the massage gun helped with the pain and has been doing at home.  Pt states fecal leakage was much better this week when not having the pain.  PAIN:  Are you having pain? No  PRECAUTIONS: None  WEIGHT BEARING RESTRICTIONS: No  FALLS:  Has patient fallen in last 6 months? No  LIVING ENVIRONMENT: Lives with: lives with  their family Lives in: House/apartment   OCCUPATION: retired   PLOF: Independent  PATIENT GOALS: to have more regular bowels and less leakage  PERTINENT HISTORY:  Migraine, hernia, TIA Sexual abuse:NO   BOWEL MOVEMENT: Pain with bowel movement: No Type of bowel movement:Type (Bristol Stool Scale) 4-6, Frequency daily, and Strain No Fully empty rectum: Yes: feels like it but then about an hour later with need to return and empty again, 2-3 times Leakage: Yes: always liquid  when happening and with passing gas, notices an increase with working out Pads: No Fiber supplement: Yes: metamucil  URINATION: Pain with urination: No Fully empty bladder: Yes:   Stream: Strong Urgency: No Frequency: not quicker than every 2 hours Leakage:  none Pads: No  INTERCOURSE: Pain with intercourse:  not painful    OBJECTIVE:   DIAGNOSTIC FINDINGS:    COGNITION: Overall cognitive status: Within functional limits for tasks assessed     SENSATION: Light touch: Appears intact Proprioception: Appears intact  MUSCLE LENGTH: Bil hamstrings and adductors limited by 25%   POSTURE: rounded shoulders and forward head    LUMBARAROM/PROM:  A/PROM A/PROM  eval  Flexion Limited by 25%  Extension WFL  Right lateral flexion Limited by 25%  Left lateral flexion Limited by 25%  Right rotation Limited by 25%  Left rotation Limited by 25%   (Blank rows = not tested)  LOWER EXTREMITY AROM/PROM:  [facility]  LOWER EXTREMITY MMT:  Bil hips grossly 4/5, knees and ankles 5/5  PALPATION: GENERAL no TTP but Lt abdominal fascial tightness throughout lt side              External Perineal Exam redness noted around EAS but no pain              Internal Pelvic Floor no TTP Patient confirms identification and approves PT to assess internal pelvic floor and treatment Yes  PELVIC MMT:   MMT eval  Internal Anal Sphincter 5/5, 30s, 10 reps  External Anal Sphincter   Puborectalis   Diastasis  Recti   (Blank rows = not tested)  TONE: WFL  TODAY'S TREATMENT:                                                                                                                              DATE:   01/20/22  NMRE: all exercises cued for coordination of pelvic floor and breathing mechanics for decreased pressure at pelvic floor and decreased leakage. Keeping core engaged and ribcage against the mat - UE overhead in hook lying Manual:  Trigger Point Dry-Needling  Treatment instructions: Expect mild to moderate muscle soreness. S/S of pneumothorax if dry needled over a lung field, and to seek immediate medical attention should they occur. Patient verbalized understanding of these instructions and education.  Patient Consent Given: Yes Education handout provided: Yes Muscles treated: lumbar multifidi and Lt piriformis and glute med Electrical stimulation performed: No Parameters: N/A Treatment response/outcome: increased soft tissue length  Manual to lumbar and glutes on left side Patient confirms identification and approves physical therapist to perform internal soft tissue work  Left coccygeus and coccyx mobs Abdominal fascial release    PATIENT EDUCATION:  Education details: [id number] Person educated: Patient Education method: Programmer, multimedia, Facilities manager, Actor cues, Verbal cues, and Handouts Education comprehension: verbalized understanding and returned demonstration  HOME EXERCISE PROGRAM: [email or web address]  ASSESSMENT:  CLINICAL IMPRESSION: Patient did well with dry needling and demonstrates improved soft tissue  mobility.  Pt responded well to fascial release around colon with more movement in ascending colon and sigmoid colon.  Pt was educated on exercises to work on coordination of the core and diaphragm as needed for reduced tone in lumbar and pelvic floor muscles.  Pt would benefit from additional PT to further address deficits.    OBJECTIVE IMPAIRMENTS: decreased  coordination, increased fascial restrictions, impaired flexibility, improper body mechanics, postural dysfunction, and pain.   ACTIVITY LIMITATIONS: lifting, squatting, and continence  PARTICIPATION LIMITATIONS: community activity  PERSONAL FACTORS: Time since onset of injury/illness/exacerbation are also affecting patient's functional outcome.   REHAB POTENTIAL: Good  CLINICAL DECISION MAKING: Stable/uncomplicated  EVALUATION COMPLEXITY: Low   GOALS: Goals reviewed with patient? Yes  SHORT TERM GOALS: Target date: 12/23/22  Pt to be I with HEP.  Baseline Goal status: MET  2.  Pt will report her BMs are complete at least 75% of the time due to improved bowel habits and evacuation techniques.  Baseline:  Goal status: MET  3.  Pt to be I with voiding and breathing mechanics for improved bowel habits.  Baseline:  Goal status: MET   LONG TERM GOALS: Target date: 02/24/22  Pt to be I with advanced HEP.  Baseline:  Goal status: IN PROGRESS  2.  Pt to report no more than 2 bowel movement per day for improved voiding habits.  Baseline:  Goal status: MET - 1 most of the time, sometimes 2  3.  Pt to report no more than 1 instance of fecal leakage in one month due to improved voiding habits and decreased strain at pelvic floor.  Baseline: better with less back pain Goal status: IN PROGRESS  PLAN:  PT FREQUENCY: every other week  PT DURATION:  4 sessions  PLANNED INTERVENTIONS: Therapeutic exercises, Therapeutic activity, Neuromuscular re-education, Patient/Family education, Self Care, Joint mobilization, Dry Needling, Electrical stimulation, Spinal mobilization, Cryotherapy, Moist heat, Biofeedback, and Manual therapy  PLAN FOR NEXT SESSION: coordination of pelvic floor, transversus abdominus and breathing with strengthening exercises, f/u on dry needling and internal soft tissue release do dry needling #2 left piriformis and lumbar bil   Gustavus Bryant, PT,  DPT 01/20/22 8:58 AM

## 2022-01-27 DIAGNOSIS — K080 Exfoliation of teeth due to systemic causes: Secondary | ICD-10-CM | POA: Diagnosis not present

## 2022-02-01 ENCOUNTER — Encounter: Payer: Self-pay | Admitting: Physical Therapy

## 2022-02-01 ENCOUNTER — Ambulatory Visit: Payer: Medicare Other | Admitting: Physical Therapy

## 2022-02-01 DIAGNOSIS — M6281 Muscle weakness (generalized): Secondary | ICD-10-CM | POA: Diagnosis not present

## 2022-02-01 DIAGNOSIS — R279 Unspecified lack of coordination: Secondary | ICD-10-CM

## 2022-02-01 DIAGNOSIS — R293 Abnormal posture: Secondary | ICD-10-CM

## 2022-02-01 NOTE — Therapy (Addendum)
OUTPATIENT PHYSICAL THERAPY MALE PELVIC TREATMENT   Patient Name: Harold Sherman MRN: [id number] DOB:Feb 01, 1955, 67 y.o., male Today's Date: 02/01/2022  END OF SESSION:  PT End of Session - 02/01/22 1017     Visit Number 5    Date for PT Re-Evaluation 02/24/22    Authorization Type BCBS    PT Start Time 1015    PT Stop Time 1055    PT Time Calculation (min) 40 min    Activity Tolerance Patient tolerated treatment well    Behavior During Therapy WFL for tasks assessed/performed               Past Medical History:  Diagnosis Date   Migraine    TIA (transient ischemic attack)    Past Surgical History:  Procedure Laterality Date   APPENDECTOMY     TONSILLECTOMY     Patient Active Problem List   Diagnosis Date Noted   Hernia of abdominal cavity 12/14/2011   FUO (fever of unknown origin) 11/17/2011    PCP: not listed per chart  REFERRING PROVIDER: R15.9 (ICD-10-CM) - Full incontinence of feces  REFERRING DIAG: Koirala, Dibas, MD   THERAPY DIAG:  Muscle weakness (generalized)  Abnormal posture  Unspecified lack of coordination  Rationale for Evaluation and Treatment: Rehabilitation  ONSET DATE: 6-8 months has worsened but started in 2019 after a 13 hour flight and then had low back pain and pain with sitting but treated pain with PT and medical needs and this has been better.   SUBJECTIVE:                                                                                                                                                                                           SUBJECTIVE STATEMENT: Pt states a small improvement since last week but overall it is much better. I still feel moisture and have to wipe 2-3x/week , but only 1-2x that I actually feel anything on my underwear.  PAIN:  Are you having pain? No  PRECAUTIONS: None  WEIGHT BEARING RESTRICTIONS: No  FALLS:  Has patient fallen in last 6 months? No  LIVING ENVIRONMENT: Lives with: lives  with their family Lives in: House/apartment   OCCUPATION: retired   PLOF: Independent  PATIENT GOALS: to have more regular bowels and less leakage  PERTINENT HISTORY:  Migraine, hernia, TIA Sexual abuse:NO   BOWEL MOVEMENT: Pain with bowel movement: No Type of bowel movement:Type (Bristol Stool Scale) 4-6, Frequency daily, and Strain No Fully empty rectum: Yes: feels like it but then about an hour later with need to return and empty again, 2-3 times Leakage: Yes: always  liquid when happening and with passing gas, notices an increase with working out Pads: No Fiber supplement: Yes: metamucil  URINATION: Pain with urination: No Fully empty bladder: Yes:   Stream: Strong Urgency: No Frequency: not quicker than every 2 hours Leakage:  none Pads: No  INTERCOURSE: Pain with intercourse:  not painful    OBJECTIVE:   DIAGNOSTIC FINDINGS:    COGNITION: Overall cognitive status: Within functional limits for tasks assessed     SENSATION: Light touch: Appears intact Proprioception: Appears intact  MUSCLE LENGTH: Bil hamstrings and adductors limited by 25%   POSTURE: rounded shoulders and forward head    LUMBARAROM/PROM:  A/PROM A/PROM  eval  Flexion Limited by 25%  Extension WFL  Right lateral flexion Limited by 25%  Left lateral flexion Limited by 25%  Right rotation Limited by 25%  Left rotation Limited by 25%   (Blank rows = not tested)  LOWER EXTREMITY AROM/PROM:  [facility]  LOWER EXTREMITY MMT:  Bil hips grossly 4/5, knees and ankles 5/5  PALPATION: GENERAL no TTP but Lt abdominal fascial tightness throughout lt side              External Perineal Exam redness noted around EAS but no pain              Internal Pelvic Floor no TTP Patient confirms identification and approves PT to assess internal pelvic floor and treatment Yes  PELVIC MMT:   MMT eval  Internal Anal Sphincter 5/5, 30s, 10 reps  External Anal Sphincter   Puborectalis    Diastasis Recti   (Blank rows = not tested)  TONE: WFL  TODAY'S TREATMENT:                                                                                                                              DATE:   02/01/22  NMRE: all exercises cued for coordination of pelvic floor and breathing mechanics for decreased pressure at pelvic floor and decreased leakage. Keeping core engaged and ribcage against the mat - ball overhead and ball between knees and engage core with exhale Quadruped - reaching with exhale and cues to keep spine neutral - 10x Manual:  Trigger Point Dry-Needling  Treatment instructions: Expect mild to moderate muscle soreness. S/S of pneumothorax if dry needled over a lung field, and to seek immediate medical attention should they occur. Patient verbalized understanding of these instructions and education.  Patient Consent Given: Yes Education handout provided: Yes Muscles treated: lumbar multifidi and Lt piriformis and glute med Electrical stimulation performed: No Parameters: N/A Treatment response/outcome: increased soft tissue length  Manual to lumbar and glutes on left side Patient confirms identification and approves physical therapist to perform internal soft tissue work  and re-assessed strength able to contract and hold 6 sec both IAS and EAS, can hold puborectalis against resistance. Left coccygeus and coccyx mobs - only 1 min as was not very tight today  PATIENT EDUCATION:  Education details: [phone] Person educated: Patient Education method: Explanation, Demonstration, Tactile cues, Verbal cues, and Handouts Education comprehension: verbalized understanding and returned demonstration  HOME EXERCISE PROGRAM: [id number]  ASSESSMENT:  CLINICAL IMPRESSION: Patient did well with dry needling #2 and demonstrates improved soft tissue mobility.  Today's session focused more on abdominal strength and coordination with pelvic floor.  Pt still having a  hard time with ribcage mobility but did better with tactile cues.  Overall, pt is much better and sounds like he is only leaking mucous 1-2x/week.  Pt would benefit from additional PT to further address deficits.    OBJECTIVE IMPAIRMENTS: decreased coordination, increased fascial restrictions, impaired flexibility, improper body mechanics, postural dysfunction, and pain.   ACTIVITY LIMITATIONS: lifting, squatting, and continence  PARTICIPATION LIMITATIONS: community activity  PERSONAL FACTORS: Time since onset of injury/illness/exacerbation are also affecting patient's functional outcome.   REHAB POTENTIAL: Good  CLINICAL DECISION MAKING: Stable/uncomplicated  EVALUATION COMPLEXITY: Low   GOALS: Goals reviewed with patient? Yes  SHORT TERM GOALS: Target date: 12/23/22  Pt to be I with HEP.  Baseline Goal status: MET  2.  Pt will report her BMs are complete at least 75% of the time due to improved bowel habits and evacuation techniques.  Baseline:  Goal status: MET  3.  Pt to be I with voiding and breathing mechanics for improved bowel habits.  Baseline:  Goal status: MET   LONG TERM GOALS: Target date: 02/24/22  Pt to be I with advanced HEP.  Baseline:  Goal status: IN PROGRESS  2.  Pt to report no more than 2 bowel movement per day for improved voiding habits.  Baseline:  Goal status: MET - 1 most of the time, sometimes 2  3.  Pt to report no more than 1 instance of fecal leakage in one month due to improved voiding habits and decreased strain at pelvic floor.  Baseline: better with less back pain Goal status: IN PROGRESS  PLAN:  PT FREQUENCY: every other week  PT DURATION:  4 sessions  PLANNED INTERVENTIONS: Therapeutic exercises, Therapeutic activity, Neuromuscular re-education, Patient/Family education, Self Care, Joint mobilization, Dry Needling, Electrical stimulation, Spinal mobilization, Cryotherapy, Moist heat, Biofeedback, and Manual therapy  PLAN FOR  NEXT SESSION: dry needling lumbar #3; check thoracic mobility and pelvic alignment, get ribcage mobility during core and progress core exercises maybe single leg dead lift and bil dead lift  Gustavus Bryant, PT, DPT 02/01/22 12:06 PM  PHYSICAL THERAPY DISCHARGE SUMMARY  Visits from Start of Care: 5  Current functional level related to goals / functional outcomes: See above goals   Remaining deficits: See above   Education / Equipment: HEP   Patient agrees to discharge. Patient goals were partially met. Patient is being discharged due to the patient's request. Pt called and reports did not need PT anymore  Gustavus Bryant, PT, DPT 03/02/22 11:43 AM

## 2022-02-03 DIAGNOSIS — K080 Exfoliation of teeth due to systemic causes: Secondary | ICD-10-CM | POA: Diagnosis not present

## 2022-02-04 ENCOUNTER — Encounter: Payer: Self-pay | Admitting: Physical Therapy

## 2022-03-03 ENCOUNTER — Ambulatory Visit: Payer: Medicare Other | Admitting: Physical Therapy

## 2022-03-10 DIAGNOSIS — K080 Exfoliation of teeth due to systemic causes: Secondary | ICD-10-CM | POA: Diagnosis not present

## 2022-04-22 DIAGNOSIS — K080 Exfoliation of teeth due to systemic causes: Secondary | ICD-10-CM | POA: Diagnosis not present

## 2022-05-07 DIAGNOSIS — H40013 Open angle with borderline findings, low risk, bilateral: Secondary | ICD-10-CM | POA: Diagnosis not present

## 2022-07-05 DIAGNOSIS — K219 Gastro-esophageal reflux disease without esophagitis: Secondary | ICD-10-CM | POA: Diagnosis not present

## 2022-07-05 DIAGNOSIS — R151 Fecal smearing: Secondary | ICD-10-CM | POA: Diagnosis not present

## 2022-07-06 DIAGNOSIS — K080 Exfoliation of teeth due to systemic causes: Secondary | ICD-10-CM | POA: Diagnosis not present

## 2022-07-13 DIAGNOSIS — K080 Exfoliation of teeth due to systemic causes: Secondary | ICD-10-CM | POA: Diagnosis not present

## 2022-08-23 DIAGNOSIS — K1370 Unspecified lesions of oral mucosa: Secondary | ICD-10-CM | POA: Diagnosis not present

## 2022-08-27 DIAGNOSIS — K1379 Other lesions of oral mucosa: Secondary | ICD-10-CM | POA: Diagnosis not present

## 2022-09-21 DIAGNOSIS — D1039 Benign neoplasm of other parts of mouth: Secondary | ICD-10-CM | POA: Diagnosis not present

## 2022-09-21 DIAGNOSIS — K1379 Other lesions of oral mucosa: Secondary | ICD-10-CM | POA: Diagnosis not present

## 2022-10-08 DIAGNOSIS — Z79899 Other long term (current) drug therapy: Secondary | ICD-10-CM | POA: Diagnosis not present

## 2022-10-08 DIAGNOSIS — Z0001 Encounter for general adult medical examination with abnormal findings: Secondary | ICD-10-CM | POA: Diagnosis not present

## 2022-10-08 DIAGNOSIS — Z1331 Encounter for screening for depression: Secondary | ICD-10-CM | POA: Diagnosis not present

## 2022-10-08 DIAGNOSIS — E7800 Pure hypercholesterolemia, unspecified: Secondary | ICD-10-CM | POA: Diagnosis not present

## 2022-10-08 DIAGNOSIS — Z125 Encounter for screening for malignant neoplasm of prostate: Secondary | ICD-10-CM | POA: Diagnosis not present

## 2022-10-08 DIAGNOSIS — G43909 Migraine, unspecified, not intractable, without status migrainosus: Secondary | ICD-10-CM | POA: Diagnosis not present

## 2022-10-08 DIAGNOSIS — K2930 Chronic superficial gastritis without bleeding: Secondary | ICD-10-CM | POA: Diagnosis not present

## 2022-10-11 DIAGNOSIS — K1379 Other lesions of oral mucosa: Secondary | ICD-10-CM | POA: Diagnosis not present

## 2022-10-28 DIAGNOSIS — K080 Exfoliation of teeth due to systemic causes: Secondary | ICD-10-CM | POA: Diagnosis not present

## 2022-11-08 DIAGNOSIS — H40013 Open angle with borderline findings, low risk, bilateral: Secondary | ICD-10-CM | POA: Diagnosis not present

## 2022-12-06 DIAGNOSIS — L578 Other skin changes due to chronic exposure to nonionizing radiation: Secondary | ICD-10-CM | POA: Diagnosis not present

## 2022-12-06 DIAGNOSIS — D225 Melanocytic nevi of trunk: Secondary | ICD-10-CM | POA: Diagnosis not present

## 2022-12-06 DIAGNOSIS — L814 Other melanin hyperpigmentation: Secondary | ICD-10-CM | POA: Diagnosis not present

## 2022-12-14 DIAGNOSIS — H524 Presbyopia: Secondary | ICD-10-CM | POA: Diagnosis not present

## 2023-03-02 DIAGNOSIS — K080 Exfoliation of teeth due to systemic causes: Secondary | ICD-10-CM | POA: Diagnosis not present

## 2023-04-05 DIAGNOSIS — M25551 Pain in right hip: Secondary | ICD-10-CM | POA: Diagnosis not present

## 2023-04-05 DIAGNOSIS — M5430 Sciatica, unspecified side: Secondary | ICD-10-CM | POA: Diagnosis not present

## 2023-04-08 DIAGNOSIS — E7800 Pure hypercholesterolemia, unspecified: Secondary | ICD-10-CM | POA: Diagnosis not present

## 2023-04-08 DIAGNOSIS — F5101 Primary insomnia: Secondary | ICD-10-CM | POA: Diagnosis not present

## 2023-04-08 DIAGNOSIS — N401 Enlarged prostate with lower urinary tract symptoms: Secondary | ICD-10-CM | POA: Diagnosis not present

## 2023-04-08 DIAGNOSIS — Z79899 Other long term (current) drug therapy: Secondary | ICD-10-CM | POA: Diagnosis not present

## 2023-04-22 DIAGNOSIS — M5430 Sciatica, unspecified side: Secondary | ICD-10-CM | POA: Diagnosis not present

## 2023-04-22 DIAGNOSIS — M25551 Pain in right hip: Secondary | ICD-10-CM | POA: Diagnosis not present

## 2023-05-05 DIAGNOSIS — M545 Low back pain, unspecified: Secondary | ICD-10-CM | POA: Diagnosis not present

## 2023-05-13 DIAGNOSIS — M545 Low back pain, unspecified: Secondary | ICD-10-CM | POA: Diagnosis not present

## 2023-05-31 DIAGNOSIS — H40013 Open angle with borderline findings, low risk, bilateral: Secondary | ICD-10-CM | POA: Diagnosis not present

## 2023-06-02 ENCOUNTER — Other Ambulatory Visit: Payer: Self-pay | Admitting: Medical Genetics

## 2023-06-14 DIAGNOSIS — K080 Exfoliation of teeth due to systemic causes: Secondary | ICD-10-CM | POA: Diagnosis not present

## 2023-06-15 DIAGNOSIS — K080 Exfoliation of teeth due to systemic causes: Secondary | ICD-10-CM | POA: Diagnosis not present

## 2023-06-21 ENCOUNTER — Other Ambulatory Visit: Payer: Self-pay

## 2023-06-21 DIAGNOSIS — Z006 Encounter for examination for normal comparison and control in clinical research program: Secondary | ICD-10-CM

## 2023-07-01 LAB — GENECONNECT MOLECULAR SCREEN: Genetic Analysis Overall Interpretation: NEGATIVE

## 2023-07-14 DIAGNOSIS — K080 Exfoliation of teeth due to systemic causes: Secondary | ICD-10-CM | POA: Diagnosis not present

## 2023-07-19 DIAGNOSIS — K080 Exfoliation of teeth due to systemic causes: Secondary | ICD-10-CM | POA: Diagnosis not present

## 2023-07-27 DIAGNOSIS — K219 Gastro-esophageal reflux disease without esophagitis: Secondary | ICD-10-CM | POA: Diagnosis not present

## 2023-07-29 DIAGNOSIS — K080 Exfoliation of teeth due to systemic causes: Secondary | ICD-10-CM | POA: Diagnosis not present

## 2023-08-11 DIAGNOSIS — B370 Candidal stomatitis: Secondary | ICD-10-CM | POA: Diagnosis not present

## 2023-09-20 DIAGNOSIS — B370 Candidal stomatitis: Secondary | ICD-10-CM | POA: Diagnosis not present

## 2023-09-20 DIAGNOSIS — B351 Tinea unguium: Secondary | ICD-10-CM | POA: Diagnosis not present

## 2023-10-24 DIAGNOSIS — M7711 Lateral epicondylitis, right elbow: Secondary | ICD-10-CM | POA: Diagnosis not present

## 2023-10-24 DIAGNOSIS — R52 Pain, unspecified: Secondary | ICD-10-CM | POA: Diagnosis not present

## 2023-10-25 ENCOUNTER — Encounter (INDEPENDENT_AMBULATORY_CARE_PROVIDER_SITE_OTHER): Payer: Self-pay

## 2023-10-25 DIAGNOSIS — K143 Hypertrophy of tongue papillae: Secondary | ICD-10-CM | POA: Diagnosis not present

## 2023-10-26 DIAGNOSIS — E7800 Pure hypercholesterolemia, unspecified: Secondary | ICD-10-CM | POA: Diagnosis not present

## 2023-10-26 DIAGNOSIS — Z0001 Encounter for general adult medical examination with abnormal findings: Secondary | ICD-10-CM | POA: Diagnosis not present

## 2023-10-26 DIAGNOSIS — Z1331 Encounter for screening for depression: Secondary | ICD-10-CM | POA: Diagnosis not present

## 2023-10-26 DIAGNOSIS — Z125 Encounter for screening for malignant neoplasm of prostate: Secondary | ICD-10-CM | POA: Diagnosis not present

## 2023-10-26 DIAGNOSIS — F5101 Primary insomnia: Secondary | ICD-10-CM | POA: Diagnosis not present

## 2023-10-26 DIAGNOSIS — E611 Iron deficiency: Secondary | ICD-10-CM | POA: Diagnosis not present

## 2023-10-26 DIAGNOSIS — Z79899 Other long term (current) drug therapy: Secondary | ICD-10-CM | POA: Diagnosis not present

## 2023-10-26 DIAGNOSIS — Z23 Encounter for immunization: Secondary | ICD-10-CM | POA: Diagnosis not present

## 2023-10-26 DIAGNOSIS — B351 Tinea unguium: Secondary | ICD-10-CM | POA: Diagnosis not present

## 2023-12-05 DIAGNOSIS — L0100 Impetigo, unspecified: Secondary | ICD-10-CM | POA: Diagnosis not present

## 2023-12-05 DIAGNOSIS — L821 Other seborrheic keratosis: Secondary | ICD-10-CM | POA: Diagnosis not present

## 2023-12-05 DIAGNOSIS — D225 Melanocytic nevi of trunk: Secondary | ICD-10-CM | POA: Diagnosis not present

## 2023-12-06 DIAGNOSIS — H40013 Open angle with borderline findings, low risk, bilateral: Secondary | ICD-10-CM | POA: Diagnosis not present
